# Patient Record
Sex: Female | Born: 1960
Health system: Southern US, Community
[De-identification: ages and names within clinical notes are randomized; demographics above are authoritative.]

## PROBLEM LIST (undated history)

## (undated) DIAGNOSIS — D219 Benign neoplasm of connective and other soft tissue, unspecified: Secondary | ICD-10-CM

## (undated) DIAGNOSIS — F329 Major depressive disorder, single episode, unspecified: Secondary | ICD-10-CM

## (undated) DIAGNOSIS — N6019 Diffuse cystic mastopathy of unspecified breast: Secondary | ICD-10-CM

## (undated) DIAGNOSIS — F32A Depression, unspecified: Secondary | ICD-10-CM

## (undated) DIAGNOSIS — Z780 Asymptomatic menopausal state: Secondary | ICD-10-CM

## (undated) DIAGNOSIS — K219 Gastro-esophageal reflux disease without esophagitis: Secondary | ICD-10-CM

## (undated) DIAGNOSIS — E559 Vitamin D deficiency, unspecified: Secondary | ICD-10-CM

## (undated) DIAGNOSIS — K649 Unspecified hemorrhoids: Secondary | ICD-10-CM

## (undated) DIAGNOSIS — E010 Iodine-deficiency related diffuse (endemic) goiter: Secondary | ICD-10-CM

## (undated) DIAGNOSIS — R002 Palpitations: Secondary | ICD-10-CM

## (undated) HISTORY — PX: PELVIC LAPAROSCOPY: SHX162

## (undated) HISTORY — PX: BREAST SURGERY: SHX581

## (undated) HISTORY — DX: Gastro-esophageal reflux disease without esophagitis: K21.9

## (undated) HISTORY — PX: NASAL SEPTUM SURGERY: SHX37

## (undated) HISTORY — DX: Benign neoplasm of connective and other soft tissue, unspecified: D21.9

---

## 1988-02-02 HISTORY — PX: BREAST EXCISIONAL BIOPSY: SUR124

## 1998-02-04 ENCOUNTER — Other Ambulatory Visit: Admission: RE | Admit: 1998-02-04 | Discharge: 1998-02-04 | Payer: Self-pay | Admitting: Obstetrics and Gynecology

## 1999-02-11 ENCOUNTER — Other Ambulatory Visit: Admission: RE | Admit: 1999-02-11 | Discharge: 1999-02-11 | Payer: Self-pay | Admitting: Obstetrics and Gynecology

## 1999-03-23 ENCOUNTER — Encounter: Payer: Self-pay | Admitting: Obstetrics and Gynecology

## 1999-03-23 ENCOUNTER — Encounter: Admission: RE | Admit: 1999-03-23 | Discharge: 1999-03-23 | Payer: Self-pay | Admitting: Obstetrics and Gynecology

## 1999-03-27 ENCOUNTER — Encounter: Payer: Self-pay | Admitting: Obstetrics and Gynecology

## 1999-03-27 ENCOUNTER — Encounter: Admission: RE | Admit: 1999-03-27 | Discharge: 1999-03-27 | Payer: Self-pay | Admitting: Obstetrics and Gynecology

## 1999-10-08 ENCOUNTER — Encounter: Admission: RE | Admit: 1999-10-08 | Discharge: 1999-10-08 | Payer: Self-pay | Admitting: Surgery

## 1999-10-08 ENCOUNTER — Encounter: Payer: Self-pay | Admitting: Surgery

## 1999-10-08 ENCOUNTER — Encounter (INDEPENDENT_AMBULATORY_CARE_PROVIDER_SITE_OTHER): Payer: Self-pay | Admitting: Specialist

## 1999-10-09 ENCOUNTER — Other Ambulatory Visit: Admission: RE | Admit: 1999-10-09 | Discharge: 1999-10-09 | Payer: Self-pay | Admitting: Surgery

## 2000-04-06 ENCOUNTER — Encounter: Payer: Self-pay | Admitting: Obstetrics and Gynecology

## 2000-04-06 ENCOUNTER — Encounter: Admission: RE | Admit: 2000-04-06 | Discharge: 2000-04-06 | Payer: Self-pay | Admitting: Obstetrics and Gynecology

## 2001-02-21 ENCOUNTER — Encounter: Admission: RE | Admit: 2001-02-21 | Discharge: 2001-02-21 | Payer: Self-pay | Admitting: Obstetrics and Gynecology

## 2001-02-21 ENCOUNTER — Other Ambulatory Visit: Admission: RE | Admit: 2001-02-21 | Discharge: 2001-02-21 | Payer: Self-pay | Admitting: Obstetrics and Gynecology

## 2001-02-21 ENCOUNTER — Encounter: Payer: Self-pay | Admitting: Obstetrics and Gynecology

## 2002-05-18 ENCOUNTER — Encounter: Admission: RE | Admit: 2002-05-18 | Discharge: 2002-05-18 | Payer: Self-pay | Admitting: Obstetrics and Gynecology

## 2002-05-18 ENCOUNTER — Other Ambulatory Visit: Admission: RE | Admit: 2002-05-18 | Discharge: 2002-05-18 | Payer: Self-pay | Admitting: Obstetrics and Gynecology

## 2002-05-18 ENCOUNTER — Encounter: Payer: Self-pay | Admitting: Obstetrics and Gynecology

## 2002-05-25 ENCOUNTER — Encounter: Admission: RE | Admit: 2002-05-25 | Discharge: 2002-05-25 | Payer: Self-pay | Admitting: Obstetrics and Gynecology

## 2002-05-25 ENCOUNTER — Encounter: Payer: Self-pay | Admitting: Obstetrics and Gynecology

## 2003-06-06 ENCOUNTER — Encounter: Admission: RE | Admit: 2003-06-06 | Discharge: 2003-06-06 | Payer: Self-pay | Admitting: Obstetrics and Gynecology

## 2003-06-21 ENCOUNTER — Encounter: Admission: RE | Admit: 2003-06-21 | Discharge: 2003-06-21 | Payer: Self-pay | Admitting: Obstetrics and Gynecology

## 2003-06-21 ENCOUNTER — Other Ambulatory Visit: Admission: RE | Admit: 2003-06-21 | Discharge: 2003-06-21 | Payer: Self-pay | Admitting: Obstetrics and Gynecology

## 2004-06-24 ENCOUNTER — Encounter: Admission: RE | Admit: 2004-06-24 | Discharge: 2004-06-24 | Payer: Self-pay | Admitting: Obstetrics and Gynecology

## 2004-06-24 ENCOUNTER — Other Ambulatory Visit: Admission: RE | Admit: 2004-06-24 | Discharge: 2004-06-24 | Payer: Self-pay | Admitting: Obstetrics and Gynecology

## 2004-08-14 ENCOUNTER — Ambulatory Visit: Payer: Self-pay | Admitting: Internal Medicine

## 2005-06-25 ENCOUNTER — Encounter: Admission: RE | Admit: 2005-06-25 | Discharge: 2005-06-25 | Payer: Self-pay | Admitting: Obstetrics and Gynecology

## 2005-06-25 ENCOUNTER — Other Ambulatory Visit: Admission: RE | Admit: 2005-06-25 | Discharge: 2005-06-25 | Payer: Self-pay | Admitting: Obstetrics and Gynecology

## 2005-12-13 ENCOUNTER — Ambulatory Visit: Payer: Self-pay | Admitting: Unknown Physician Specialty

## 2006-07-15 ENCOUNTER — Encounter: Admission: RE | Admit: 2006-07-15 | Discharge: 2006-07-15 | Payer: Self-pay | Admitting: Obstetrics and Gynecology

## 2006-07-15 ENCOUNTER — Other Ambulatory Visit: Admission: RE | Admit: 2006-07-15 | Discharge: 2006-07-15 | Payer: Self-pay | Admitting: Obstetrics and Gynecology

## 2006-10-21 ENCOUNTER — Other Ambulatory Visit: Admission: RE | Admit: 2006-10-21 | Discharge: 2006-10-21 | Payer: Self-pay | Admitting: Obstetrics and Gynecology

## 2006-12-05 ENCOUNTER — Ambulatory Visit: Payer: Self-pay | Admitting: Internal Medicine

## 2007-07-03 HISTORY — PX: HYSTEROSCOPY: SHX211

## 2007-07-14 ENCOUNTER — Ambulatory Visit (HOSPITAL_BASED_OUTPATIENT_CLINIC_OR_DEPARTMENT_OTHER): Admission: RE | Admit: 2007-07-14 | Discharge: 2007-07-14 | Payer: Self-pay | Admitting: Obstetrics and Gynecology

## 2007-07-14 ENCOUNTER — Encounter: Payer: Self-pay | Admitting: Obstetrics and Gynecology

## 2007-07-28 ENCOUNTER — Encounter: Admission: RE | Admit: 2007-07-28 | Discharge: 2007-07-28 | Payer: Self-pay | Admitting: Obstetrics and Gynecology

## 2007-07-28 ENCOUNTER — Other Ambulatory Visit: Admission: RE | Admit: 2007-07-28 | Discharge: 2007-07-28 | Payer: Self-pay | Admitting: Obstetrics and Gynecology

## 2007-08-07 ENCOUNTER — Encounter: Admission: RE | Admit: 2007-08-07 | Discharge: 2007-08-07 | Payer: Self-pay | Admitting: Obstetrics and Gynecology

## 2008-06-10 ENCOUNTER — Ambulatory Visit: Payer: Self-pay | Admitting: Obstetrics and Gynecology

## 2008-07-08 ENCOUNTER — Emergency Department: Payer: Self-pay | Admitting: Emergency Medicine

## 2008-07-18 ENCOUNTER — Ambulatory Visit: Payer: Self-pay | Admitting: Obstetrics and Gynecology

## 2008-07-19 ENCOUNTER — Ambulatory Visit: Payer: Self-pay | Admitting: Otolaryngology

## 2008-08-12 ENCOUNTER — Encounter: Admission: RE | Admit: 2008-08-12 | Discharge: 2008-08-12 | Payer: Self-pay | Admitting: Obstetrics and Gynecology

## 2008-08-12 ENCOUNTER — Encounter: Payer: Self-pay | Admitting: Obstetrics and Gynecology

## 2008-08-12 ENCOUNTER — Other Ambulatory Visit: Admission: RE | Admit: 2008-08-12 | Discharge: 2008-08-12 | Payer: Self-pay | Admitting: Obstetrics and Gynecology

## 2008-08-12 ENCOUNTER — Ambulatory Visit: Payer: Self-pay | Admitting: Obstetrics and Gynecology

## 2009-07-18 ENCOUNTER — Other Ambulatory Visit: Admission: RE | Admit: 2009-07-18 | Discharge: 2009-07-18 | Payer: Self-pay | Admitting: Obstetrics and Gynecology

## 2009-07-18 ENCOUNTER — Encounter: Admission: RE | Admit: 2009-07-18 | Discharge: 2009-07-18 | Payer: Self-pay | Admitting: Obstetrics and Gynecology

## 2009-07-18 ENCOUNTER — Ambulatory Visit: Payer: Self-pay | Admitting: Obstetrics and Gynecology

## 2009-12-03 ENCOUNTER — Ambulatory Visit: Payer: Self-pay | Admitting: Obstetrics and Gynecology

## 2010-02-23 ENCOUNTER — Encounter: Payer: Self-pay | Admitting: Obstetrics and Gynecology

## 2010-06-16 NOTE — Op Note (Signed)
Briana Griffin, Briana Griffin                ACCOUNT NO.:  [id number]   MEDICAL RECORD NO.:  [id number]          PATIENT TYPE:  AMB   LOCATION:  [facility]                         FACILITY:  [facility]   PHYSICIAN:  Daniel L. Gottsegen, M.D.DATE OF BIRTH:  [birth year]   DATE OF PROCEDURE:  DATE OF DISCHARGE:                               OPERATIVE REPORT   PREOPERATIVE DIAGNOSES:  1. Dysfunctional uterine bleeding.  2. Endometrial polyp.   POSTOPERATIVE DIAGNOSES:  1. Dysfunctional uterine bleeding.  2. Endometrial polyp.   OPERATIONS:  Dilatation and curettage, hysteroscopy.   SURGEON:  Daniel L. Eda Paschal, M.D.   ANESTHESIA:  General.   INDICATIONS:  The patient is a 50 year old gravida 1, para 1, AB 0, who  had presented to the office with a 2-week history of menometrorrhagia.  Saline infusion hysterogram was done in the office.  The patient does  have intramural myomas but, in addition, she had something in her  endometrial cavity consistent with a small endometrial polyp, about 1.5  cm.  As a result of this and need for a tissue diagnosis, the .  patient  was taken to the operating room for a hysteroscopy D&C.   FINDINGS:  External is normal.  BUS is normal.  Vaginal is normal.  Cervix showed a stenotic os.  When it dilated a lot of mucus came out of  the os.  Uterus is slightly enlarged from myomas.  There is no uterine  descensus.  Adnexa fail to reveal masses.  At the time of hysteroscopy,  the patient had an endometrial polyp coming off the posterior wall of  the fundus of about 1.5 cm.  Other than that the lining of the  endometrium  actually appeared somewhat atrophic with very little  tissue.  Tubal ostia, top of the fundus, anterior and posterior walls of  the fundus, lower uterine segment and endocervical canal were all free  of disease.   PROCEDURE:  After adequate general anesthesia, the patient was placed in  the dorsal lithotomy position, prepped and draped in the usual  sterile  manner.  A single-tooth tenaculum was placed on the anterior lip of the  cervix, the cervix dilated to a #31 Pratt dilator.  As the cervix was  being dilated a lot of mucus came out of the os, possibly related to the  fact that the os had been stenotic.  When she was dilated, the  hysteroscopic resectoscope was introduced into the cavity.  A camera was  used for magnification and 3% sorbitol was used to expand the  intrauterine cavity.  A 90 degree  wire loop was utilized, attached to  appropriate Bovie settings.  The above findings were noted.  The  endometrial polyp was excised with the wire loop without difficulty.  There was no bleeding noted.  Endometrial curettings were obtained.  There was no bleeding noted.  The  patient tolerated the procedure well.  Fluid deficit was 75 mL.  The  procedure was terminated and the patient left the operating room in  satisfactory condition.      Daniel L. Eda Paschal, M.D.  Electronically Signed     DLG/MEDQ  D:  07/14/2007  T:  07/14/2007  Job:  [id number]

## 2010-07-15 ENCOUNTER — Other Ambulatory Visit: Payer: Self-pay | Admitting: Obstetrics and Gynecology

## 2010-07-15 DIAGNOSIS — Z1231 Encounter for screening mammogram for malignant neoplasm of breast: Secondary | ICD-10-CM

## 2010-08-07 ENCOUNTER — Ambulatory Visit (INDEPENDENT_AMBULATORY_CARE_PROVIDER_SITE_OTHER): Payer: BC Managed Care – PPO | Admitting: Obstetrics and Gynecology

## 2010-08-07 ENCOUNTER — Other Ambulatory Visit: Payer: Self-pay | Admitting: Obstetrics and Gynecology

## 2010-08-07 ENCOUNTER — Other Ambulatory Visit: Payer: BC Managed Care – PPO

## 2010-08-07 ENCOUNTER — Ambulatory Visit
Admission: RE | Admit: 2010-08-07 | Discharge: 2010-08-07 | Disposition: A | Discharge status: Home / Self Care | Payer: BC Managed Care – PPO | Source: Ambulatory Visit | Attending: Obstetrics and Gynecology | Admitting: Obstetrics and Gynecology

## 2010-08-07 ENCOUNTER — Other Ambulatory Visit (HOSPITAL_COMMUNITY)
Admission: RE | Admit: 2010-08-07 | Discharge: 2010-08-07 | Disposition: A | Discharge status: Home / Self Care | Payer: BC Managed Care – PPO | Source: Ambulatory Visit | Attending: Obstetrics and Gynecology | Admitting: Obstetrics and Gynecology

## 2010-08-07 DIAGNOSIS — Z1231 Encounter for screening mammogram for malignant neoplasm of breast: Secondary | ICD-10-CM

## 2010-08-07 DIAGNOSIS — Z124 Encounter for screening for malignant neoplasm of cervix: Secondary | ICD-10-CM

## 2010-08-07 DIAGNOSIS — R1031 Right lower quadrant pain: Secondary | ICD-10-CM

## 2010-08-07 DIAGNOSIS — Z803 Family history of malignant neoplasm of breast: Secondary | ICD-10-CM

## 2010-08-07 DIAGNOSIS — Z01419 Encounter for gynecological examination (general) (routine) without abnormal findings: Secondary | ICD-10-CM

## 2010-08-11 ENCOUNTER — Ambulatory Visit: Payer: Self-pay | Admitting: Internal Medicine

## 2010-10-29 LAB — URINALYSIS, ROUTINE W REFLEX MICROSCOPIC: pH: 7.5

## 2010-10-29 LAB — POCT PREGNANCY, URINE: Preg Test, Ur: NEGATIVE

## 2010-10-29 LAB — POCT HEMOGLOBIN-HEMACUE: Hemoglobin: 16.5 — ABNORMAL HIGH

## 2011-03-12 ENCOUNTER — Ambulatory Visit: Payer: Self-pay | Admitting: Otolaryngology

## 2011-03-12 LAB — CBC WITH DIFFERENTIAL/PLATELET
Basophil #: 0.1 10*3/uL (ref 0.0–0.1)
Basophil %: 1.4 %
Eosinophil #: 0 10*3/uL (ref 0.0–0.7)
Eosinophil %: 0.5 %
HCT: 48.4 % — ABNORMAL HIGH (ref 35.0–47.0)
HGB: 15.8 g/dL (ref 12.0–16.0)
MCV: 91 fL (ref 80–100)
Monocyte #: 0.5 10*3/uL (ref 0.0–0.7)
Monocyte %: 7.4 %
Platelet: 200 10*3/uL (ref 150–440)
RBC: 5.32 10*6/uL — ABNORMAL HIGH (ref 3.80–5.20)
WBC: 6.4 10*3/uL (ref 3.6–11.0)

## 2011-03-12 LAB — T4, FREE: Free Thyroxine: 0.94 ng/dL (ref 0.76–1.46)

## 2011-04-26 ENCOUNTER — Telehealth: Payer: Self-pay | Admitting: *Deleted

## 2011-04-26 DIAGNOSIS — R102 Pelvic and perineal pain: Secondary | ICD-10-CM

## 2011-04-26 NOTE — Telephone Encounter (Signed)
Ultrasound with office visit 

## 2011-04-26 NOTE — Telephone Encounter (Signed)
Patient informed.  appts to set up. 

## 2011-04-26 NOTE — Telephone Encounter (Signed)
Patient calls today c/o bilat "ovary pain" and back discomfort for about two weeks now.  No troubles going to the bathroom but has a history of cysts in the past.  Lives in [place] and wants to know if you would see her but also check an ultrasound because of her traveling a good distance.

## 2011-04-29 DIAGNOSIS — K219 Gastro-esophageal reflux disease without esophagitis: Secondary | ICD-10-CM | POA: Insufficient documentation

## 2011-04-29 DIAGNOSIS — D219 Benign neoplasm of connective and other soft tissue, unspecified: Secondary | ICD-10-CM | POA: Insufficient documentation

## 2011-04-29 DIAGNOSIS — N809 Endometriosis, unspecified: Secondary | ICD-10-CM | POA: Insufficient documentation

## 2011-05-06 ENCOUNTER — Ambulatory Visit: Payer: BC Managed Care – PPO | Admitting: Obstetrics and Gynecology

## 2011-05-06 ENCOUNTER — Other Ambulatory Visit: Payer: BC Managed Care – PPO

## 2011-06-29 ENCOUNTER — Other Ambulatory Visit: Payer: Self-pay | Admitting: Obstetrics and Gynecology

## 2011-06-29 ENCOUNTER — Telehealth: Payer: Self-pay | Admitting: *Deleted

## 2011-06-29 DIAGNOSIS — Z1231 Encounter for screening mammogram for malignant neoplasm of breast: Secondary | ICD-10-CM

## 2011-06-29 NOTE — Telephone Encounter (Signed)
Pt informed with the below note, transferred to appointment desk 

## 2011-06-29 NOTE — Telephone Encounter (Signed)
Pt called on 04/26/11 c/o bilat ovary pain that comes and goes, OV with ultrasound scheduled, but pt canceled appointment. She has her annual coming up in July and would like to know if she could have ultrasound and annual done same day? Please advise

## 2011-06-29 NOTE — Telephone Encounter (Signed)
If insurance would pay for both the same day.

## 2011-08-24 ENCOUNTER — Telehealth: Payer: Self-pay | Admitting: *Deleted

## 2011-08-24 NOTE — Telephone Encounter (Signed)
Pt called and said her PCP told her that she has Vit. D deficiency at 7.1, she will start on Vit D medication. Pt asked if she should have a bone density done because of this? Please advise

## 2011-09-03 ENCOUNTER — Ambulatory Visit: Payer: BC Managed Care – PPO

## 2011-09-03 ENCOUNTER — Encounter: Payer: Self-pay | Admitting: Obstetrics and Gynecology

## 2011-09-03 ENCOUNTER — Ambulatory Visit (INDEPENDENT_AMBULATORY_CARE_PROVIDER_SITE_OTHER): Payer: BC Managed Care – PPO

## 2011-09-03 ENCOUNTER — Other Ambulatory Visit (HOSPITAL_COMMUNITY)
Admission: RE | Admit: 2011-09-03 | Discharge: 2011-09-03 | Disposition: A | Discharge status: Home / Self Care | Payer: BC Managed Care – PPO | Source: Ambulatory Visit | Attending: Obstetrics and Gynecology | Admitting: Obstetrics and Gynecology

## 2011-09-03 ENCOUNTER — Ambulatory Visit (INDEPENDENT_AMBULATORY_CARE_PROVIDER_SITE_OTHER): Payer: BC Managed Care – PPO | Admitting: Obstetrics and Gynecology

## 2011-09-03 VITALS — BP 124/78 | Ht 61.0 in | Wt 190.0 lb

## 2011-09-03 DIAGNOSIS — Z01419 Encounter for gynecological examination (general) (routine) without abnormal findings: Secondary | ICD-10-CM

## 2011-09-03 DIAGNOSIS — N949 Unspecified condition associated with female genital organs and menstrual cycle: Secondary | ICD-10-CM

## 2011-09-03 DIAGNOSIS — R102 Pelvic and perineal pain: Secondary | ICD-10-CM

## 2011-09-03 NOTE — Progress Notes (Signed)
Patient came to see me today for her annual GYN exam. She is menopausal has not bled for 3 years. She has had some menopausal symptoms but controls them with Celexa and BuSpar. She continues to have intermittent right lower quadrant pain. She does have a history of endometriosis. As a result of this we'll schedule ultrasound today as well as our visit. She is scheduled for endoscopy and colonoscopy due to pain and reflux.  She is having no dyspareunia. She was supposed to have her mammogram today but there was a problem at the breast center and she will return for her mammogram and  bone density. Her last bone density was normal. She had her lab work from her PCP and her vitamin D was 7. She is currently on 50,000 IUs of vitamin D weekly. She has had no fractures. She had cervical dysplasia greater than 25 years ago and has had normal Paps since then. Her last Pap was last year and was normal.  Physical examination: Kennon Portela present. HEENT within normal limits. Neck: Thyroid not large. No masses. Supraclavicular nodes: not enlarged. Breasts: Examined in both sitting and lying  position. No skin changes and no masses. Abdomen: Soft no guarding rebound or masses or hernia. Pelvic: External: Within normal limits. BUS: Within normal limits. Vaginal:within normal limits. Good estrogen effect. No evidence of cystocele rectocele or enterocele. Cervix: clean. Uterus: Normal size and shape. Adnexa: No masses. Rectovaginal exam: Confirmatory and negative. Extremities: Within normal limits.  Assessment: #1. Right lower quadrant pain with history of endometriosis #2. Vitamin D deficiency. #3. Fibroids  Plan: Ultrasound was done. She still has 3 small fibroids that are stable in size. Her ovaries are normal. Her cul-de-sac is free of fluid. She was reassured. She'll get mammogram and a bone density.The new Pap smear guidelines were discussed with the patient. Pap done.

## 2011-09-05 LAB — URINALYSIS W MICROSCOPIC + REFLEX CULTURE
Casts: NONE SEEN
Leukocytes, UA: NEGATIVE
pH: 7 (ref 5.0–8.0)

## 2011-09-08 LAB — URINE CULTURE

## 2011-09-09 ENCOUNTER — Other Ambulatory Visit: Payer: Self-pay | Admitting: Obstetrics and Gynecology

## 2011-09-09 DIAGNOSIS — N390 Urinary tract infection, site not specified: Secondary | ICD-10-CM

## 2011-09-09 MED ORDER — CIPROFLOXACIN HCL 500 MG PO TABS: 500.0000 mg | ORAL_TABLET | Freq: Two times a day (BID) | ORAL | Status: AC

## 2011-09-13 ENCOUNTER — Ambulatory Visit: Payer: BC Managed Care – PPO

## 2011-09-22 ENCOUNTER — Ambulatory Visit: Payer: BC Managed Care – PPO

## 2011-09-24 ENCOUNTER — Ambulatory Visit: Payer: BC Managed Care – PPO

## 2011-09-28 ENCOUNTER — Ambulatory Visit
Admission: RE | Admit: 2011-09-28 | Discharge: 2011-09-28 | Disposition: A | Discharge status: Home / Self Care | Payer: BC Managed Care – PPO | Source: Ambulatory Visit | Attending: Obstetrics and Gynecology | Admitting: Obstetrics and Gynecology

## 2011-09-28 DIAGNOSIS — Z1231 Encounter for screening mammogram for malignant neoplasm of breast: Secondary | ICD-10-CM

## 2012-06-12 ENCOUNTER — Other Ambulatory Visit: Payer: Self-pay

## 2012-06-12 DIAGNOSIS — Z1231 Encounter for screening mammogram for malignant neoplasm of breast: Secondary | ICD-10-CM

## 2012-07-31 ENCOUNTER — Other Ambulatory Visit: Payer: Self-pay | Admitting: Internal Medicine

## 2012-07-31 ENCOUNTER — Telehealth: Payer: Self-pay | Admitting: *Deleted

## 2012-07-31 DIAGNOSIS — N63 Unspecified lump in unspecified breast: Secondary | ICD-10-CM

## 2012-07-31 NOTE — Telephone Encounter (Signed)
PT CALLED C/O LUMP IN RIGHT BREAST, (PT HAS ANNUAL SCHEDULED WITH YOU ON 09/04/12) REQUESTING A ORDER TO BE PLACED AT BREAST CENTER. I EXPLAINED TO PT THAT POLICY IS TO SEE THE PHYSICIAN FIRST. PT SAID "IT DOESN'T MAKE SENSE TO MAKE OV WITH PHYSICIAN WHEN I CLEARLY NEED A MAMMOGRAM APPOINTMENT". OKAY TO PLACE ORDER? OR OV WITH YOU?

## 2012-07-31 NOTE — Telephone Encounter (Signed)
PT INFORMED WITH THE BELOW NOTE, PT SAID SHE IS GOING TO HAVE HER PCP WRITE ORDER BECAUSE SHE IS NOT GOING TO PAY A COPAY FOR OV.

## 2012-07-31 NOTE — Telephone Encounter (Signed)
Patient needs examination before any order is placed. Depending on what we feel may change what we order

## 2012-08-10 ENCOUNTER — Other Ambulatory Visit: Payer: BC Managed Care – PPO

## 2012-08-18 ENCOUNTER — Ambulatory Visit
Admission: RE | Admit: 2012-08-18 | Discharge: 2012-08-18 | Disposition: A | Discharge status: Home / Self Care | Payer: BC Managed Care – PPO | Source: Ambulatory Visit | Attending: Internal Medicine | Admitting: Internal Medicine

## 2012-08-18 DIAGNOSIS — N63 Unspecified lump in unspecified breast: Secondary | ICD-10-CM

## 2012-08-23 ENCOUNTER — Encounter: Payer: Self-pay | Admitting: Gynecology

## 2012-09-04 ENCOUNTER — Ambulatory Visit: Payer: BC Managed Care – PPO

## 2012-09-04 ENCOUNTER — Other Ambulatory Visit: Payer: Self-pay | Admitting: Internal Medicine

## 2012-09-04 ENCOUNTER — Ambulatory Visit (INDEPENDENT_AMBULATORY_CARE_PROVIDER_SITE_OTHER): Payer: BC Managed Care – PPO | Admitting: Women's Health

## 2012-09-04 ENCOUNTER — Encounter: Payer: Self-pay | Admitting: Gynecology

## 2012-09-04 ENCOUNTER — Ambulatory Visit
Admission: RE | Admit: 2012-09-04 | Discharge: 2012-09-04 | Disposition: A | Discharge status: Home / Self Care | Payer: BC Managed Care – PPO | Source: Ambulatory Visit | Attending: Internal Medicine | Admitting: Internal Medicine

## 2012-09-04 ENCOUNTER — Ambulatory Visit: Admission: RE | Admit: 2012-09-04 | Payer: BC Managed Care – PPO | Source: Ambulatory Visit

## 2012-09-04 ENCOUNTER — Encounter: Payer: Self-pay | Admitting: Women's Health

## 2012-09-04 VITALS — BP 120/71 | Ht 61.0 in | Wt 192.0 lb

## 2012-09-04 DIAGNOSIS — Z01419 Encounter for gynecological examination (general) (routine) without abnormal findings: Secondary | ICD-10-CM

## 2012-09-04 DIAGNOSIS — N63 Unspecified lump in unspecified breast: Secondary | ICD-10-CM

## 2012-09-04 DIAGNOSIS — Z23 Encounter for immunization: Secondary | ICD-10-CM

## 2012-09-04 NOTE — Progress Notes (Signed)
Briana Griffin [birth year] [id number]    History:    The patient presents for annual exam.  Postmenopausal/no bleeding/no HRT/with rare intercourse per choice. Primary care labs and meds. Effexor for depression with anxiety. Benign breast biopsy, normal mammogram after. Breast cancer [place] 60s, [place] 70's, and  maternal cousin age 54. Negative colonoscopy 2013. History of small fibroids and endometriosis. DEXA  2013 in [place], stable per patient, primary care managing. Normal DEXA 2011. Mild dysplasia 25 years ago, normal Paps after. Has occasional lower abdominal cramping, resolves without treatment. Denies GI or GU changes.  Past medical history, past surgical history, family history and social history were all reviewed and documented in the EPIC chart. 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 Has an 77-month-old grandson Briana Griffin, son 18 doing well.   ROS:  A  ROS was performed and pertinent positives and negatives are included in the history.  Exam:  Filed Vitals:   09/04/12 1407  BP: 120/71    General appearance:  Normal Head/Neck:  Normal, without cervical or supraclavicular adenopathy. Thyroid:  Symmetrical, normal in size, without palpable masses or nodularity. Respiratory  Effort:  Normal  Auscultation:  Clear without wheezing or rhonchi Cardiovascular  Auscultation:  Regular rate, without rubs, murmurs or gallops  Edema/varicosities:  Not grossly evident Abdominal  Soft,nontender, without masses, guarding or rebound.  Liver/spleen:  No organomegaly noted  Hernia:  None appreciated  Skin  Inspection:  Grossly normal  Palpation:  Grossly normal Neurologic/psychiatric  Orientation:  Normal with appropriate conversation.  Mood/affect:  Normal  Genitourinary    Breasts: Examined lying and sitting.     Right: Without masses, retractions, discharge or axillary adenopathy.     Left: Without masses, retractions, discharge  or axillary adenopathy.   Inguinal/mons:  Normal without inguinal adenopathy  External genitalia:  Normal  BUS/Urethra/Skene's glands:  Normal  Bladder:  Normal  Vagina:  Normal  Cervix:  Normal  Uterus:   normal in size, shape and contour.  Midline and mobile  Adnexa/parametria:     Rt: Without masses or tenderness.   Lt: Without masses or tenderness.  Anus and perineum: Normal  Digital rectal exam: Normal sphincter tone without palpated masses or tenderness  Assessment/Plan:  52 y.o. MWF G1P1 for annual exam.   Postmenopausal/no HRT/no bleeding GERD Anxiety with depression-Effexor per primary care  Plan: Continue labs at primary care. SBE's, continue annual mammogram, calcium rich diet, vitamin D 50,000 weekly per primary care. Home safety and fall prevention discussed. Tdap given today. Pap, Pap normal 2012, new screening guidelines reviewed.    Harrington Challenger [facility], 2:41 PM 09/04/2012

## 2012-09-04 NOTE — Patient Instructions (Signed)
Health Recommendations for Postmenopausal Women Respected and ongoing research has looked at the most common causes of death, disability, and poor quality of life in postmenopausal women. The causes include heart disease, diseases of blood vessels, diabetes, depression, cancer, and bone loss (osteoporosis). Many things can be done to help lower the chances of developing these and other common problems: CARDIOVASCULAR DISEASE Heart Disease: A heart attack is a medical emergency. Know the signs and symptoms of a heart attack. Below are things women can do to reduce their risk for heart disease.   Do not smoke. If you smoke, quit.  Aim for a healthy weight. Being overweight causes many preventable deaths. Eat a healthy and balanced diet and drink an adequate amount of liquids.  Get moving. Make a commitment to be more physically active. Aim for 30 minutes of activity on most, if not all days of the week.  Eat for heart health. Choose a diet that is low in saturated fat and cholesterol and eliminate trans fat. Include whole grains, vegetables, and fruits. Read and understand the labels on food containers before buying.  Know your numbers. Ask your caregiver to check your blood pressure, cholesterol (total, HDL, LDL, triglycerides) and blood glucose. Work with your caregiver on improving your entire clinical picture.  High blood pressure. Limit or stop your table salt intake (try salt substitute and food seasonings). Avoid salty foods and drinks. Read labels on food containers before buying. Eating well and exercising can help control high blood pressure. STROKE  Stroke is a medical emergency. Stroke may be the result of a blood clot in a blood vessel in the brain or by a brain hemorrhage (bleeding). Know the signs and symptoms of a stroke. To lower the risk of developing a stroke:  Avoid fatty foods.  Quit smoking.  Control your diabetes, blood pressure, and irregular heart rate. THROMBOPHLEBITIS  (BLOOD CLOT) OF THE LEG  Becoming overweight and leading a stationary lifestyle may also contribute to developing blood clots. Controlling your diet and exercising will help lower the risk of developing blood clots. CANCER SCREENING  Breast Cancer: Take steps to reduce your risk of breast cancer.  You should practice "breast self-awareness." This means understanding the normal appearance and feel of your breasts and should include breast self-examination. Any changes detected, no matter how small, should be reported to your caregiver.  After age 40, you should have a clinical breast exam (CBE) every year.  Starting at age 40, you should consider having a mammogram (breast X-ray) every year.  If you have a family history of breast cancer, talk to your caregiver about genetic screening.  If you are at high risk for breast cancer, talk to your caregiver about having an MRI and a mammogram every year.  Intestinal or Stomach Cancer: Tests to consider are a rectal exam, fecal occult blood, sigmoidoscopy, and colonoscopy. Women who are high risk may need to be screened at an earlier age and more often.  Cervical Cancer:  Beginning at age 30, you should have a Pap test every 3 years as long as the past 3 Pap tests have been normal.  If you have had past treatment for cervical cancer or a condition that could lead to cancer, you need Pap tests and screening for cancer for at least 20 years after your treatment.  If you had a hysterectomy for a problem that was not cancer or a condition that could lead to cancer, then you no longer need Pap tests.    If you are between ages 65 and 70, and you have had normal Pap tests going back 10 years, you no longer need Pap tests.  If Pap tests have been discontinued, risk factors (such as a new sexual partner) need to be reassessed to determine if screening should be resumed.  Some medical problems can increase the chance of getting cervical cancer. In these  cases, your caregiver may recommend more frequent screening and Pap tests.  Uterine Cancer: If you have vaginal bleeding after reaching menopause, you should notify your caregiver.  Ovarian cancer: Other than yearly pelvic exams, there are no reliable tests available to screen for ovarian cancer at this time except for yearly pelvic exams.  Lung Cancer: Yearly chest X-rays can detect lung cancer and should be done on high risk women, such as cigarette smokers and women with chronic lung disease (emphysema).  Skin Cancer: A complete body skin exam should be done at your yearly examination. Avoid overexposure to the sun and ultraviolet light lamps. Use a strong sun block cream when in the sun. All of these things are important in lowering the risk of skin cancer. MENOPAUSE Menopause Symptoms: Hormone therapy products are effective for treating symptoms associated with menopause:  Moderate to severe hot flashes.  Night sweats.  Mood swings.  Headaches.  Tiredness.  Loss of sex drive.  Insomnia.  Other symptoms. Hormone replacement carries certain risks, especially in older women. Women who use or are thinking about using estrogen or estrogen with progestin treatments should discuss that with their caregiver. Your caregiver will help you understand the benefits and risks. The ideal dose of hormone replacement therapy is not known. The Food and Drug Administration (FDA) has concluded that hormone therapy should be used only at the lowest doses and for the shortest amount of time to reach treatment goals.  OSTEOPOROSIS Protecting Against Bone Loss and Preventing Fracture: If you use hormone therapy for prevention of bone loss (osteoporosis), the risks for bone loss must outweigh the risk of the therapy. Ask your caregiver about other medications known to be safe and effective for preventing bone loss and fractures. To guard against bone loss or fractures, the following is recommended:  If  you are less than age 50, take 1000 mg of calcium and at least 600 mg of Vitamin D per day.  If you are greater than age 50 but less than age 70, take 1200 mg of calcium and at least 600 mg of Vitamin D per day.  If you are greater than age 70, take 1200 mg of calcium and at least 800 mg of Vitamin D per day. Smoking and excessive alcohol intake increases the risk of osteoporosis. Eat foods rich in calcium and vitamin D and do weight bearing exercises several times a week as your caregiver suggests. DIABETES Diabetes Melitus: If you have Type I or Type 2 diabetes, you should keep your blood sugar under control with diet, exercise and recommended medication. Avoid too many sweets, starchy and fatty foods. Being overweight can make control more difficult. COGNITION AND MEMORY Cognition and Memory: Menopausal hormone therapy is not recommended for the prevention of cognitive disorders such as Alzheimer's disease or memory loss.  DEPRESSION  Depression may occur at any age, but is common in elderly women. The reasons may be because of physical, medical, social (loneliness), or financial problems and needs. If you are experiencing depression because of medical problems and control of symptoms, talk to your caregiver about this. Physical activity and   exercise may help with mood and sleep. Community and volunteer involvement may help your sense of value and worth. If you have depression and you feel that the problem is getting worse or becoming severe, talk to your caregiver about treatment options that are best for you. ACCIDENTS  Accidents are common and can be serious in the elderly woman. Prepare your house to prevent accidents. Eliminate throw rugs, place hand bars in the bath, shower and toilet areas. Avoid wearing high heeled shoes or walking on wet, snowy, and icy areas. Limit or stop driving if you have vision or hearing problems, or you feel you are unsteady with you movements and  reflexes. HEPATITIS C Hepatitis C is a type of viral infection affecting the liver. It is spread mainly through contact with blood from an infected person. It can be treated, but if left untreated, it can lead to severe liver damage over years. Many people who are infected do not know that the virus is in their blood. If you are a "baby-boomer", it is recommended that you have one screening test for Hepatitis C. IMMUNIZATIONS  Several immunizations are important to consider having during your senior years, including:   Tetanus, diptheria, and pertussis booster shot.  Influenza every year before the flu season begins.  Pneumonia vaccine.  Shingles vaccine.  Others as indicated based on your specific needs. Talk to your caregiver about these. Document Released: 03/12/2005 Document Revised: 01/05/2012 Document Reviewed: 11/06/2007 Exit[employer] Patient Information 2014 [employer].  

## 2012-09-13 ENCOUNTER — Telehealth: Payer: Self-pay | Admitting: *Deleted

## 2012-09-13 NOTE — Telephone Encounter (Signed)
Pt informed with pap results on 09/04/12 visit.

## 2012-09-28 ENCOUNTER — Encounter: Payer: Self-pay | Admitting: Gynecology

## 2012-09-28 ENCOUNTER — Ambulatory Visit: Payer: BC Managed Care – PPO

## 2013-08-15 ENCOUNTER — Other Ambulatory Visit: Payer: Self-pay

## 2013-08-15 DIAGNOSIS — Z1231 Encounter for screening mammogram for malignant neoplasm of breast: Secondary | ICD-10-CM

## 2013-09-05 ENCOUNTER — Encounter: Payer: BC Managed Care – PPO | Admitting: Women's Health

## 2013-09-07 ENCOUNTER — Ambulatory Visit: Payer: BC Managed Care – PPO

## 2013-09-07 ENCOUNTER — Ambulatory Visit
Admission: RE | Admit: 2013-09-07 | Discharge: 2013-09-07 | Disposition: A | Discharge status: Home / Self Care | Payer: BC Managed Care – PPO | Source: Ambulatory Visit

## 2013-09-07 ENCOUNTER — Other Ambulatory Visit (HOSPITAL_COMMUNITY)
Admission: RE | Admit: 2013-09-07 | Discharge: 2013-09-07 | Disposition: A | Discharge status: Home / Self Care | Payer: BC Managed Care – PPO | Source: Ambulatory Visit | Attending: Women's Health | Admitting: Women's Health

## 2013-09-07 ENCOUNTER — Encounter: Payer: Self-pay | Admitting: Women's Health

## 2013-09-07 ENCOUNTER — Ambulatory Visit (INDEPENDENT_AMBULATORY_CARE_PROVIDER_SITE_OTHER): Payer: BC Managed Care – PPO | Admitting: Women's Health

## 2013-09-07 VITALS — BP 119/76 | Ht 61.0 in | Wt 172.4 lb

## 2013-09-07 DIAGNOSIS — Z1231 Encounter for screening mammogram for malignant neoplasm of breast: Secondary | ICD-10-CM

## 2013-09-07 DIAGNOSIS — Z1151 Encounter for screening for human papillomavirus (HPV): Secondary | ICD-10-CM | POA: Insufficient documentation

## 2013-09-07 DIAGNOSIS — Z01419 Encounter for gynecological examination (general) (routine) without abnormal findings: Secondary | ICD-10-CM | POA: Insufficient documentation

## 2013-09-07 DIAGNOSIS — Z113 Encounter for screening for infections with a predominantly sexual mode of transmission: Secondary | ICD-10-CM

## 2013-09-07 NOTE — Addendum Note (Signed)
Addended by: Thamas Jaegers on: 09/07/2013 03:22 PM   Modules accepted: Orders

## 2013-09-07 NOTE — Patient Instructions (Signed)
Health Recommendations for Postmenopausal Women Respected and ongoing research has looked at the most common causes of death, disability, and poor quality of life in postmenopausal women. The causes include heart disease, diseases of blood vessels, diabetes, depression, cancer, and bone loss (osteoporosis). Many things can be done to help lower the chances of developing these and other common problems. CARDIOVASCULAR DISEASE Heart Disease: A heart attack is a medical emergency. Know the signs and symptoms of a heart attack. Below are things women can do to reduce their risk for heart disease.   Do not smoke. If you smoke, quit.  Aim for a healthy weight. Being overweight causes many preventable deaths. Eat a healthy and balanced diet and drink an adequate amount of liquids.  Get moving. Make a commitment to be more physically active. Aim for 30 minutes of activity on most, if not all days of the week.  Eat for heart health. Choose a diet that is low in saturated fat and cholesterol and eliminate trans fat. Include whole grains, vegetables, and fruits. Read and understand the labels on food containers before buying.  Know your numbers. Ask your caregiver to check your blood pressure, cholesterol (total, HDL, LDL, triglycerides) and blood glucose. Work with your caregiver on improving your entire clinical picture.  High blood pressure. Limit or stop your table salt intake (try salt substitute and food seasonings). Avoid salty foods and drinks. Read labels on food containers before buying. Eating well and exercising can help control high blood pressure. STROKE  Stroke is a medical emergency. Stroke may be the result of a blood clot in a blood vessel in the brain or by a brain hemorrhage (bleeding). Know the signs and symptoms of a stroke. To lower the risk of developing a stroke:  Avoid fatty foods.  Quit smoking.  Control your diabetes, blood pressure, and irregular heart rate. THROMBOPHLEBITIS  (BLOOD CLOT) OF THE LEG  Becoming overweight and leading a stationary lifestyle may also contribute to developing blood clots. Controlling your diet and exercising will help lower the risk of developing blood clots. CANCER SCREENING  Breast Cancer: Take steps to reduce your risk of breast cancer.  You should practice "breast self-awareness." This means understanding the normal appearance and feel of your breasts and should include breast self-examination. Any changes detected, no matter how small, should be reported to your caregiver.  After age 40, you should have a clinical breast exam (CBE) every year.  Starting at age 40, you should consider having a mammogram (breast X-ray) every year.  If you have a family history of breast cancer, talk to your caregiver about genetic screening.  If you are at high risk for breast cancer, talk to your caregiver about having an MRI and a mammogram every year.  Intestinal or Stomach Cancer: Tests to consider are a rectal exam, fecal occult blood, sigmoidoscopy, and colonoscopy. Women who are high risk may need to be screened at an earlier age and more often.  Cervical Cancer:  Beginning at age 30, you should have a Pap test every 3 years as long as the past 3 Pap tests have been normal.  If you have had past treatment for cervical cancer or a condition that could lead to cancer, you need Pap tests and screening for cancer for at least 20 years after your treatment.  If you had a hysterectomy for a problem that was not cancer or a condition that could lead to cancer, then you no longer need Pap tests.    If you are between ages 65 and 70, and you have had normal Pap tests going back 10 years, you no longer need Pap tests.  If Pap tests have been discontinued, risk factors (such as a new sexual partner) need to be reassessed to determine if screening should be resumed.  Some medical problems can increase the chance of getting cervical cancer. In these  cases, your caregiver may recommend more frequent screening and Pap tests.  Uterine Cancer: If you have vaginal bleeding after reaching menopause, you should notify your caregiver.  Ovarian Cancer: Other than yearly pelvic exams, there are no reliable tests available to screen for ovarian cancer at this time except for yearly pelvic exams.  Lung Cancer: Yearly chest X-rays can detect lung cancer and should be done on high risk women, such as cigarette smokers and women with chronic lung disease (emphysema).  Skin Cancer: A complete body skin exam should be done at your yearly examination. Avoid overexposure to the sun and ultraviolet light lamps. Use a strong sun block cream when in the sun. All of these things are important for lowering the risk of skin cancer. MENOPAUSE Menopause Symptoms: Hormone therapy products are effective for treating symptoms associated with menopause:  Moderate to severe hot flashes.  Night sweats.  Mood swings.  Headaches.  Tiredness.  Loss of sex drive.  Insomnia.  Other symptoms. Hormone replacement carries certain risks, especially in older women. Women who use or are thinking about using estrogen or estrogen with progestin treatments should discuss that with their caregiver. Your caregiver will help you understand the benefits and risks. The ideal dose of hormone replacement therapy is not known. The Food and Drug Administration (FDA) has concluded that hormone therapy should be used only at the lowest doses and for the shortest amount of time to reach treatment goals.  OSTEOPOROSIS Protecting Against Bone Loss and Preventing Fracture If you use hormone therapy for prevention of bone loss (osteoporosis), the risks for bone loss must outweigh the risk of the therapy. Ask your caregiver about other medications known to be safe and effective for preventing bone loss and fractures. To guard against bone loss or fractures, the following is recommended:  If  you are younger than age 50, take 1000 mg of calcium and at least 600 mg of Vitamin D per day.  If you are older than age 50 but younger than age 70, take 1200 mg of calcium and at least 600 mg of Vitamin D per day.  If you are older than age 70, take 1200 mg of calcium and at least 800 mg of Vitamin D per day. Smoking and excessive alcohol intake increases the risk of osteoporosis. Eat foods rich in calcium and vitamin D and do weight bearing exercises several times a week as your caregiver suggests. DIABETES Diabetes Mellitus: If you have type I or type 2 diabetes, you should keep your blood sugar under control with diet, exercise, and recommended medication. Avoid starchy and fatty foods, and too many sweets. Being overweight can make diabetes control more difficult. COGNITION AND MEMORY Cognition and Memory: Menopausal hormone therapy is not recommended for the prevention of cognitive disorders such as Alzheimer's disease or memory loss.  DEPRESSION  Depression may occur at any age, but it is common in elderly women. This may be because of physical, medical, social (loneliness), or financial problems and needs. If you are experiencing depression because of medical problems and control of symptoms, talk to your caregiver about this. Physical   activity and exercise may help with mood and sleep. Community and volunteer involvement may improve your sense of value and worth. If you have depression and you feel that the problem is getting worse or becoming severe, talk to your caregiver about which treatment options are best for you. ACCIDENTS  Accidents are common and can be serious in elderly woman. Prepare your house to prevent accidents. Eliminate throw rugs, place hand bars in bath, shower, and toilet areas. Avoid wearing high heeled shoes or walking on wet, snowy, and icy areas. Limit or stop driving if you have vision or hearing problems, or if you feel you are unsteady with your movements and  reflexes. HEPATITIS C Hepatitis C is a type of viral infection affecting the liver. It is spread mainly through contact with blood from an infected person. It can be treated, but if left untreated, it can lead to severe liver damage over the years. Many people who are infected do not know that the virus is in their blood. If you are a "baby-boomer", it is recommended that you have one screening test for Hepatitis C. IMMUNIZATIONS  Several immunizations are important to consider having during your senior years, including:   Tetanus, diphtheria, and pertussis booster shot.  Influenza every year before the flu season begins.  Pneumonia vaccine.  Shingles vaccine.  Others, as indicated based on your specific needs. Talk to your caregiver about these. Document Released: 03/12/2005 Document Revised: 06/04/2013 Document Reviewed: 11/06/2007 [employer] Patient Information 2015 [employer]. This information is not intended to replace advice given to you by your health care provider. Make sure you discuss any questions you have with your health care provider.  

## 2013-09-07 NOTE — Progress Notes (Signed)
Briana Griffin [place] Feb 24, 1960 [id number]    History:    Presents for annual exam.  Postmenopausal/no bleeding/no HRT. Abnormal Pap greater than 20 years ago normal Paps since. Normal mammogram history, normal after ultrasound 2014. 2009 benign endometrial polyp. 2013 negative colonoscopy. Primary care manages anxiety and depression with Effexor and occasional xanax. Reports normal DEXA at primary care 2013  Past medical history, past surgical history, family history and social history were all reviewed and documented in the EPIC chart. Has been helping to care for aging parents, father died, mother with dementia having issues with sister and 26 yo nephew who moved back into the family home also. Has been separated from her husband greater than 2 years, in a new relationship. Has a grandson [name].  ROS:  A  12 point ROS was performed and pertinent positives and negatives are included.  Exam:  Filed Vitals:   09/07/13 1428  BP: 119/76    General appearance:  Normal Thyroid:  Symmetrical, normal in size, without palpable masses or nodularity. Respiratory  Auscultation:  Clear without wheezing or rhonchi Cardiovascular  Auscultation:  Regular rate, without rubs, murmurs or gallops  Edema/varicosities:  Not grossly evident Abdominal  Soft,nontender, without masses, guarding or rebound.  Liver/spleen:  No organomegaly noted  Hernia:  None appreciated  Skin  Inspection:  Grossly normal   Breasts: Examined lying and sitting.     Right: Without masses, retractions, discharge or axillary adenopathy.     Left: Without masses, retractions, discharge or axillary adenopathy. Gentitourinary   Inguinal/mons:  Normal without inguinal adenopathy  External genitalia:  Normal  BUS/Urethra/Skene's glands:  Normal  Vagina:  Normal  Cervix:  Normal  Uterus:   normal in size, shape and contour.  Midline and mobile  Adnexa/parametria:     Rt: Without masses or tenderness.   Lt: Without masses or  tenderness.  Anus and perineum: Normal  Digital rectal exam: Normal sphincter tone without palpated masses or tenderness  Assessment/Plan:  53 y.o. DWF G1P1 for annual exam.    Anxiety/depression-primary care manages labs and meds Postmenopausal/no HRT/no bleeding STD screen  Plan: SBE's, continue annual mammogram, calcium rich diet, vitamin D 1000 encouraged. Reviewed importance of regular exercise, continued weight loss for health (down 20 pds). GC/Chlamydia, HIV, hep B, C., RPR. Pap with HR HPV typing. New Pap screening guidelines reviewed.  Note: This dictation was prepared with Dragon/digital dictation.  Any transcriptional errors that result are unintentional. Huel Cote [facility], 2:55 PM 09/07/2013

## 2013-09-08 LAB — GC/CHLAMYDIA PROBE AMP
CT PROBE, AMP APTIMA: NEGATIVE
GC PROBE AMP APTIMA: NEGATIVE

## 2013-09-08 LAB — RPR

## 2013-09-08 LAB — HIV ANTIBODY (ROUTINE TESTING W REFLEX): HIV 1&2 Ab, 4th Generation: NONREACTIVE

## 2013-09-08 LAB — HEPATITIS B SURFACE ANTIGEN: HEP B S AG: NEGATIVE

## 2013-09-08 LAB — HEPATITIS C ANTIBODY: HCV AB: NEGATIVE

## 2013-09-11 LAB — CYTOLOGY - PAP

## 2013-12-03 ENCOUNTER — Encounter: Payer: Self-pay | Admitting: Women's Health

## 2014-01-15 ENCOUNTER — Emergency Department: Payer: Self-pay | Admitting: Emergency Medicine

## 2014-01-15 LAB — COMPREHENSIVE METABOLIC PANEL
ALBUMIN: 3.9 g/dL (ref 3.4–5.0)
ALK PHOS: 65 U/L
ALT: 25 U/L
AST: 26 U/L (ref 15–37)
Anion Gap: 6 — ABNORMAL LOW (ref 7–16)
BILIRUBIN TOTAL: 0.9 mg/dL (ref 0.2–1.0)
BUN: 9 mg/dL (ref 7–18)
CALCIUM: 9.1 mg/dL (ref 8.5–10.1)
CHLORIDE: 107 mmol/L (ref 98–107)
CO2: 28 mmol/L (ref 21–32)
CREATININE: 0.83 mg/dL (ref 0.60–1.30)
EGFR (African American): >60
Glucose: 130 mg/dL — ABNORMAL HIGH (ref 65–99)
Osmolality: 282 (ref 275–301)
POTASSIUM: 3.6 mmol/L (ref 3.5–5.1)
Sodium: 141 mmol/L (ref 136–145)
TOTAL PROTEIN: 7.3 g/dL (ref 6.4–8.2)

## 2014-01-15 LAB — CBC
HCT: 47.5 % — AB (ref 35.0–47.0)
HGB: 15.2 g/dL (ref 12.0–16.0)
MCH: 29.6 pg (ref 26.0–34.0)
MCHC: 32 g/dL (ref 32.0–36.0)
MCV: 92 fL (ref 80–100)
Platelet: 214 10*3/uL (ref 150–440)
RBC: 5.14 10*6/uL (ref 3.80–5.20)
RDW: 13.5 % (ref 11.5–14.5)
WBC: 7.3 10*3/uL (ref 3.6–11.0)

## 2014-08-08 ENCOUNTER — Other Ambulatory Visit: Payer: Self-pay

## 2014-08-08 DIAGNOSIS — Z1231 Encounter for screening mammogram for malignant neoplasm of breast: Secondary | ICD-10-CM

## 2014-08-23 ENCOUNTER — Encounter: Payer: Self-pay | Admitting: Women's Health

## 2014-08-23 ENCOUNTER — Ambulatory Visit (INDEPENDENT_AMBULATORY_CARE_PROVIDER_SITE_OTHER): Payer: 59 | Admitting: Women's Health

## 2014-08-23 VITALS — BP 122/84 | Ht 61.0 in | Wt 156.0 lb

## 2014-08-23 DIAGNOSIS — Z113 Encounter for screening for infections with a predominantly sexual mode of transmission: Secondary | ICD-10-CM | POA: Diagnosis not present

## 2014-08-23 DIAGNOSIS — Z13220 Encounter for screening for lipoid disorders: Secondary | ICD-10-CM | POA: Diagnosis not present

## 2014-08-23 DIAGNOSIS — Z01419 Encounter for gynecological examination (general) (routine) without abnormal findings: Secondary | ICD-10-CM

## 2014-08-23 DIAGNOSIS — F4322 Adjustment disorder with anxiety: Secondary | ICD-10-CM | POA: Diagnosis not present

## 2014-08-23 DIAGNOSIS — F4323 Adjustment disorder with mixed anxiety and depressed mood: Secondary | ICD-10-CM | POA: Insufficient documentation

## 2014-08-23 LAB — COMPREHENSIVE METABOLIC PANEL
ALT: 9 U/L (ref 0–35)
AST: 16 U/L (ref 0–37)
Albumin: 4.2 g/dL (ref 3.5–5.2)
Alkaline Phosphatase: 35 U/L — ABNORMAL LOW (ref 39–117)
BUN: 12 mg/dL (ref 6–23)
CALCIUM: 9.1 mg/dL (ref 8.4–10.5)
CO2: 29 mEq/L (ref 19–32)
Chloride: 104 mEq/L (ref 96–112)
Creat: 0.75 mg/dL (ref 0.50–1.10)
Glucose, Bld: 98 mg/dL (ref 70–99)
Potassium: 3.8 mEq/L (ref 3.5–5.3)
Sodium: 143 mEq/L (ref 135–145)
Total Bilirubin: 1 mg/dL (ref 0.2–1.2)
Total Protein: 6.5 g/dL (ref 6.0–8.3)

## 2014-08-23 LAB — CBC WITH DIFFERENTIAL/PLATELET
Basophils Absolute: 0 10*3/uL (ref 0.0–0.1)
Basophils Relative: 0 % (ref 0–1)
EOS ABS: 0.1 10*3/uL (ref 0.0–0.7)
Eosinophils Relative: 1 % (ref 0–5)
HEMATOCRIT: 41.5 % (ref 36.0–46.0)
HEMOGLOBIN: 14.3 g/dL (ref 12.0–15.0)
LYMPHS ABS: 2.1 10*3/uL (ref 0.7–4.0)
LYMPHS PCT: 41 % (ref 12–46)
MCH: 30.5 pg (ref 26.0–34.0)
MCHC: 34.5 g/dL (ref 30.0–36.0)
MCV: 88.5 fL (ref 78.0–100.0)
MONOS PCT: 7 % (ref 3–12)
MPV: 11.5 fL (ref 8.6–12.4)
Monocytes Absolute: 0.4 10*3/uL (ref 0.1–1.0)
Neutro Abs: 2.6 10*3/uL (ref 1.7–7.7)
Neutrophils Relative %: 51 % (ref 43–77)
Platelets: 203 10*3/uL (ref 150–400)
RBC: 4.69 MIL/uL (ref 3.87–5.11)
RDW: 13.7 % (ref 11.5–15.5)
WBC: 5 10*3/uL (ref 4.0–10.5)

## 2014-08-23 LAB — LIPID PANEL
CHOLESTEROL: 153 mg/dL (ref 0–200)
HDL: 66 mg/dL (ref >=46)
LDL Cholesterol: 77 mg/dL (ref 0–99)
Total CHOL/HDL Ratio: 2.3 Ratio
Triglycerides: 51 mg/dL (ref <150)
VLDL: 10 mg/dL (ref 0–40)

## 2014-08-23 MED ORDER — ESCITALOPRAM OXALATE 20 MG PO TABS: 20.0000 mg | ORAL_TABLET | Freq: Every day | ORAL | Status: DC

## 2014-08-23 NOTE — Progress Notes (Signed)
Briana Griffin 11/22/1960 [id number]    History:    Presents for annual exam.  Postmenopausal/no HRT/no bleeding. Same partner, good relationship. 2009 benign endometrial polyp. 2013 negative colonoscopy. Normal Pap and mammogram history. Primary care treating anxiety and depression currently on Paxil states would like to try a different medication. Has had counseling in the past. Numerous situational stressors losing job, caring for mother with Alzheimer's, sister recently died from bone cancer, stress relationship with son. Denies feelings of harming self, states has always had mild anxiety. Normal DEXA per primary care.  Past medical history, past surgical history, family history and social history were all reviewed and documented in the EPIC chart. Has lost 15 pounds in the past year with diet/ exercise and stress.  ROS:  A ROS was performed and pertinent positives and negatives are included.  Exam:  Filed Vitals:   08/23/14 1523  BP: 122/84    General appearance:  Normal Thyroid:  Symmetrical, normal in size, without palpable masses or nodularity. Respiratory  Auscultation:  Clear without wheezing or rhonchi Cardiovascular  Auscultation:  Regular rate, without rubs, murmurs or gallops  Edema/varicosities:  Not grossly evident Abdominal  Soft,nontender, without masses, guarding or rebound.  Liver/spleen:  No organomegaly noted  Hernia:  None appreciated  Skin  Inspection:  Grossly normal   Breasts: Examined lying and sitting.     Right: Without masses, retractions, discharge or axillary adenopathy.     Left: Without masses, retractions, discharge or axillary adenopathy. Gentitourinary   Inguinal/mons:  Normal without inguinal adenopathy  External genitalia:  Normal  BUS/Urethra/Skene's glands:  Normal  Vagina:  Normal  Cervix:  Normal  Uterus:  normal in size, shape and contour.  Midline and mobile  Adnexa/parametria:     Rt: Without masses or tenderness.   Lt: Without  masses or tenderness.  Anus and perineum: Normal  Digital rectal exam: Normal sphincter tone without palpated masses or tenderness  Assessment/Plan:  54 y.o. separated WF G1 P1 for annual exam.    Postmenopausal/no HRT/no bleeding Anxiety/depression  Plan: SBE's, genuine annual screening mammogram, scheduled, regular exercise, calcium rich diet, vitamin D per primary care. States WBCs were slightly low will recheck CBC, CMP, lipid panel, UA, vitamin D, Pap normal 2015 with negative HR HPV new screening guidelines reviewed. GC/Chlamydia culture taken per request denies need for HIV, hepatitis or RPR. Long discussion on need for counseling strongly encouraged. Will try Lexapro 20 mg will start with 10 mg daily increase to 20 after several weeks. Reviewed importance of continued exercise, leisure activities, delegating care of mother as able. Call if no relief.    Huel Cote [facility], 4:23 PM 08/23/2014

## 2014-08-23 NOTE — Patient Instructions (Signed)
Generalized Anxiety Disorder Generalized anxiety disorder (GAD) is a mental disorder. It interferes with life functions, including relationships, work, and school. GAD is different from normal anxiety, which everyone experiences at some point in their lives in response to specific life events and activities. Normal anxiety actually helps [place] prepare for and get through these life events and activities. Normal anxiety goes away after the event or activity is over.  GAD causes anxiety that is not necessarily related to specific events or activities. It also causes excess anxiety in proportion to specific events or activities. The anxiety associated with GAD is also difficult to control. GAD can vary from mild to severe. People with severe GAD can have intense waves of anxiety with physical symptoms (panic attacks).  SYMPTOMS The anxiety and worry associated with GAD are difficult to control. This anxiety and worry are related to many life events and activities and also occur more days than not for 6 months or longer. People with GAD also have three or more of the following symptoms (one or more in children):  Restlessness.   Fatigue.  Difficulty concentrating.   Irritability.  Muscle tension.  Difficulty sleeping or unsatisfying sleep. DIAGNOSIS GAD is diagnosed through an assessment by your health care provider. Your health care provider will ask you questions aboutyour mood,physical symptoms, and events in your life. Your health care provider may ask you about your medical history and use of alcohol or drugs, including prescription medicines. Your health care provider may also do a physical exam and blood tests. Certain medical conditions and the use of certain substances can cause symptoms similar to those associated with GAD. Your health care provider may refer you to a mental health specialist for further evaluation. TREATMENT The following therapies are usually used to treat GAD:    Medication. Antidepressant medication usually is prescribed for long-term daily control. Antianxiety medicines may be added in severe cases, especially when panic attacks occur.   Talk therapy (psychotherapy). Certain types of talk therapy can be helpful in treating GAD by providing support, education, and guidance. A form of talk therapy called cognitive behavioral therapy can teach you healthy ways to think about and react to daily life events and activities.  Stress managementtechniques. These include yoga, meditation, and exercise and can be very helpful when they are practiced regularly. A mental health specialist can help determine which treatment is best for you. Some people see improvement with one therapy. However, other people require a combination of therapies. Document Released: 05/15/2012 Document Revised: 06/04/2013 Document Reviewed: 05/15/2012 [facility] Patient Information 2015 [place], [place]. This information is not intended to replace advice given to you by your health care provider. Make sure you discuss any questions you have with your health care provider. Health Recommendations for Postmenopausal Women Respected and ongoing research has looked at the most common causes of death, disability, and poor quality of life in postmenopausal women. The causes include heart disease, diseases of blood vessels, diabetes, depression, cancer, and bone loss (osteoporosis). Many things can be done to help lower the chances of developing these and other common problems. CARDIOVASCULAR DISEASE Heart Disease: A heart attack is a medical emergency. Know the signs and symptoms of a heart attack. Below are things women can do to reduce their risk for heart disease.   Do not smoke. If you smoke, quit.  Aim for a healthy weight. Being overweight causes many preventable deaths. Eat a healthy and balanced diet and drink an adequate amount of liquids.  Get moving.  Make a commitment to be more  physically active. Aim for 30 minutes of activity on most, if not all days of the week.  Eat for heart health. Choose a diet that is low in saturated fat and cholesterol and eliminate trans fat. Include whole grains, vegetables, and fruits. Read and understand the labels on food containers before buying.  Know your numbers. Ask your caregiver to check your blood pressure, cholesterol (total, HDL, LDL, triglycerides) and blood glucose. Work with your caregiver on improving your entire clinical picture.  High blood pressure. Limit or stop your table salt intake (try salt substitute and food seasonings). Avoid salty foods and drinks. Read labels on food containers before buying. Eating well and exercising can help control high blood pressure. STROKE  Stroke is a medical emergency. Stroke may be the result of a blood clot in a blood vessel in the brain or by a brain hemorrhage (bleeding). Know the signs and symptoms of a stroke. To lower the risk of developing a stroke:  Avoid fatty foods.  Quit smoking.  Control your diabetes, blood pressure, and irregular heart rate. THROMBOPHLEBITIS (BLOOD CLOT) OF THE LEG  Becoming overweight and leading a stationary lifestyle may also contribute to developing blood clots. Controlling your diet and exercising will help lower the risk of developing blood clots. CANCER SCREENING  Breast Cancer: Take steps to reduce your risk of breast cancer.  You should practice "breast self-awareness." This means understanding the normal appearance and feel of your breasts and should include breast self-examination. Any changes detected, no matter how small, should be reported to your caregiver.  After age 71, you should have a clinical breast exam (CBE) every year.  Starting at age 37, you should consider having a mammogram (breast X-ray) every year.  If you have a family history of breast cancer, talk to your caregiver about genetic screening.  If you are at high risk  for breast cancer, talk to your caregiver about having an MRI and a mammogram every year.  Intestinal or Stomach Cancer: Tests to consider are a rectal exam, fecal occult blood, sigmoidoscopy, and colonoscopy. Women who are high risk may need to be screened at an earlier age and more often.  Cervical Cancer:  Beginning at age 20, you should have a Pap test every 3 years as long as the past 3 Pap tests have been normal.  If you have had past treatment for cervical cancer or a condition that could lead to cancer, you need Pap tests and screening for cancer for at least 20 years after your treatment.  If you had a hysterectomy for a problem that was not cancer or a condition that could lead to cancer, then you no longer need Pap tests.  If you are between ages 57 and 103, and you have had normal Pap tests going back 10 years, you no longer need Pap tests.  If Pap tests have been discontinued, risk factors (such as a new sexual partner) need to be reassessed to determine if screening should be resumed.  Some medical problems can increase the chance of getting cervical cancer. In these cases, your caregiver may recommend more frequent screening and Pap tests.  Uterine Cancer: If you have vaginal bleeding after reaching menopause, you should notify your caregiver.  Ovarian Cancer: Other than yearly pelvic exams, there are no reliable tests available to screen for ovarian cancer at this time except for yearly pelvic exams.  Lung Cancer: Yearly chest X-rays can detect lung cancer  and should be done on high risk women, such as cigarette smokers and women with chronic lung disease (emphysema).  Skin Cancer: A complete body skin exam should be done at your yearly examination. Avoid overexposure to the sun and ultraviolet light lamps. Use a strong sun block cream when in the sun. All of these things are important for lowering the risk of skin cancer. MENOPAUSE Menopause Symptoms: Hormone therapy  products are effective for treating symptoms associated with menopause:  Moderate to severe hot flashes.  Night sweats.  Mood swings.  Headaches.  Tiredness.  Loss of sex drive.  Insomnia.  Other symptoms. Hormone replacement carries certain risks, especially in older women. Women who use or are thinking about using estrogen or estrogen with progestin treatments should discuss that with their caregiver. Your caregiver will help you understand the benefits and risks. The ideal dose of hormone replacement therapy is not known. The Food and Drug Administration (FDA) has concluded that hormone therapy should be used only at the lowest doses and for the shortest amount of time to reach treatment goals.  OSTEOPOROSIS Protecting Against Bone Loss and Preventing Fracture If you use hormone therapy for prevention of bone loss (osteoporosis), the risks for bone loss must outweigh the risk of the therapy. Ask your caregiver about other medications known to be safe and effective for preventing bone loss and fractures. To guard against bone loss or fractures, the following is recommended:  If you are younger than age 91, take 1000 mg of calcium and at least 600 mg of Vitamin D per day.  If you are older than age 47 but younger than age 77, take 1200 mg of calcium and at least 600 mg of Vitamin D per day.  If you are older than age 72, take 1200 mg of calcium and at least 800 mg of Vitamin D per day. Smoking and excessive alcohol intake increases the risk of osteoporosis. Eat foods rich in calcium and vitamin D and do weight bearing exercises several times a week as your caregiver suggests. DIABETES Diabetes Mellitus: If you have type I or type 2 diabetes, you should keep your blood sugar under control with diet, exercise, and recommended medication. Avoid starchy and fatty foods, and too many sweets. Being overweight can make diabetes control more difficult. COGNITION AND MEMORY Cognition and  Memory: Menopausal hormone therapy is not recommended for the prevention of cognitive disorders such as Alzheimer's disease or memory loss.  DEPRESSION  Depression may occur at any age, but it is common in elderly women. This may be because of physical, medical, social (loneliness), or financial problems and needs. If you are experiencing depression because of medical problems and control of symptoms, talk to your caregiver about this. Physical activity and exercise may help with mood and sleep. Community and volunteer involvement may improve your sense of value and worth. If you have depression and you feel that the problem is getting worse or becoming severe, talk to your caregiver about which treatment options are best for you. ACCIDENTS  Accidents are common and can be serious in elderly woman. Prepare your house to prevent accidents. Eliminate throw rugs, place hand bars in bath, shower, and toilet areas. Avoid wearing high heeled shoes or walking on wet, snowy, and icy areas. Limit or stop driving if you have vision or hearing problems, or if you feel you are unsteady with your movements and reflexes. HEPATITIS C Hepatitis C is a type of viral infection affecting the liver. It  is spread mainly through contact with blood from an infected person. It can be treated, but if left untreated, it can lead to severe liver damage over the years. Many people who are infected do not know that the virus is in their blood. If you are a "baby-boomer", it is recommended that you have one screening test for Hepatitis C. IMMUNIZATIONS  Several immunizations are important to consider having during your senior years, including:   Tetanus, diphtheria, and pertussis booster shot.  Influenza every year before the flu season begins.  Pneumonia vaccine.  Shingles vaccine.  Others, as indicated based on your specific needs. Talk to your caregiver about these. Document Released: 03/12/2005 Document Revised:  06/04/2013 Document Reviewed: 11/06/2007 [facility] Patient Information 2015 [place], [place]. This information is not intended to replace advice given to you by your health care provider. Make sure you discuss any questions you have with your health care provider.

## 2014-08-24 LAB — URINALYSIS W MICROSCOPIC + REFLEX CULTURE
Bilirubin Urine: NEGATIVE
Casts: NONE SEEN
Crystals: NONE SEEN
Glucose, UA: NEGATIVE mg/dL
HGB URINE DIPSTICK: NEGATIVE
KETONES UR: NEGATIVE mg/dL
Leukocytes, UA: NEGATIVE
NITRITE: NEGATIVE
Protein, ur: NEGATIVE mg/dL
Specific Gravity, Urine: 1.013 (ref 1.005–1.030)
Squamous Epithelial / LPF: NONE SEEN
UROBILINOGEN UA: 0.2 mg/dL (ref 0.0–1.0)
pH: 7 (ref 5.0–8.0)

## 2014-08-24 LAB — GC/CHLAMYDIA PROBE AMP
CT Probe RNA: NEGATIVE
GC PROBE AMP APTIMA: NEGATIVE

## 2014-08-24 LAB — VITAMIN D 25 HYDROXY (VIT D DEFICIENCY, FRACTURES): Vit D, 25-Hydroxy: 35 ng/mL (ref 30–100)

## 2014-08-27 ENCOUNTER — Encounter: Payer: Self-pay | Admitting: *Deleted

## 2014-08-28 ENCOUNTER — Ambulatory Visit: Payer: 59 | Admitting: Anesthesiology

## 2014-08-28 ENCOUNTER — Encounter: Payer: Self-pay | Admitting: Anesthesiology

## 2014-08-28 ENCOUNTER — Encounter
Admission: RE | Disposition: A | Discharge status: Home / Self Care | Payer: Self-pay | Source: Ambulatory Visit | Attending: Unknown Physician Specialty

## 2014-08-28 ENCOUNTER — Ambulatory Visit
Admission: RE | Admit: 2014-08-28 | Discharge: 2014-08-28 | Disposition: A | Discharge status: Home / Self Care | Payer: 59 | Source: Ambulatory Visit | Attending: Unknown Physician Specialty | Admitting: Unknown Physician Specialty

## 2014-08-28 DIAGNOSIS — K648 Other hemorrhoids: Secondary | ICD-10-CM | POA: Insufficient documentation

## 2014-08-28 DIAGNOSIS — R109 Unspecified abdominal pain: Secondary | ICD-10-CM | POA: Diagnosis not present

## 2014-08-28 DIAGNOSIS — D123 Benign neoplasm of transverse colon: Secondary | ICD-10-CM | POA: Diagnosis not present

## 2014-08-28 HISTORY — DX: Iodine-deficiency related diffuse (endemic) goiter: E01.0

## 2014-08-28 HISTORY — DX: Asymptomatic menopausal state: Z78.0

## 2014-08-28 HISTORY — DX: Unspecified hemorrhoids: K64.9

## 2014-08-28 HISTORY — DX: Major depressive disorder, single episode, unspecified: F32.9

## 2014-08-28 HISTORY — PX: COLONOSCOPY WITH PROPOFOL: SHX5780

## 2014-08-28 HISTORY — DX: Palpitations: R00.2

## 2014-08-28 HISTORY — DX: Depression, unspecified: F32.A

## 2014-08-28 HISTORY — DX: Diffuse cystic mastopathy of unspecified breast: N60.19

## 2014-08-28 HISTORY — DX: Vitamin D deficiency, unspecified: E55.9

## 2014-08-28 SURGERY — COLONOSCOPY WITH PROPOFOL
Anesthesia: General

## 2014-08-28 MED ORDER — PROPOFOL INFUSION 10 MG/ML OPTIME
INTRAVENOUS | Status: DC | PRN
Start: 2014-08-28 — End: 2014-08-28
  Administered 2014-08-28: 140 ug/kg/min via INTRAVENOUS

## 2014-08-28 MED ORDER — PROPOFOL 10 MG/ML IV BOLUS
INTRAVENOUS | Status: DC | PRN
  Administered 2014-08-28: 55 mg via INTRAVENOUS

## 2014-08-28 MED ORDER — PHENYLEPHRINE HCL 10 MG/ML IJ SOLN
INTRAMUSCULAR | Status: DC | PRN
  Administered 2014-08-28 (×2): 200 ug via INTRAVENOUS
  Administered 2014-08-28: 100 ug via INTRAVENOUS

## 2014-08-28 MED ORDER — FENTANYL CITRATE (PF) 100 MCG/2ML IJ SOLN
INTRAMUSCULAR | Status: DC | PRN
  Administered 2014-08-28: 50 ug via INTRAVENOUS

## 2014-08-28 MED ORDER — MIDAZOLAM HCL 5 MG/5ML IJ SOLN
INTRAMUSCULAR | Status: DC | PRN
  Administered 2014-08-28: 1 mg via INTRAVENOUS

## 2014-08-28 MED ORDER — SODIUM CHLORIDE 0.9 % IV SOLN: INTRAVENOUS | Status: DC

## 2014-08-28 MED ORDER — SODIUM CHLORIDE 0.9 % IV SOLN
INTRAVENOUS | Status: DC
  Administered 2014-08-28: 12:00:00 via INTRAVENOUS

## 2014-08-28 NOTE — Op Note (Signed)
[facility] Gastroenterology Patient Name: Briana Griffin Procedure Date: 08/28/2014 12:07 PM MRN: [id number] Account #: [id number] Date of Birth: 11/29/1960 Admit Type: Outpatient Age: 54 Room: [facility] ENDO ROOM 4 Gender: Female Note Status: Finalized Procedure:         Colonoscopy Indications:       Periumbilical abdominal pain Providers:         Manya Silvas, MD Referring MD:      Leonie Douglas. Doy Hutching, MD (Referring MD) Medicines:         Propofol per Anesthesia Complications:     No immediate complications. Procedure:         Pre-Anesthesia Assessment:                    - After reviewing the risks and benefits, the patient was                     deemed in satisfactory condition to undergo the procedure.                    After obtaining informed consent, the colonoscope was                     passed under direct vision. Throughout the procedure, the                     patient's blood pressure, pulse, and oxygen saturations                     were monitored continuously. The Colonoscope was                     introduced through the anus and advanced to the the cecum,                     identified by appendiceal orifice and ileocecal valve. The                     colonoscopy was performed without difficulty. The patient                     tolerated the procedure well. The quality of the bowel                     preparation was excellent. Findings:      Internal hemorrhoids were found during endoscopy. The hemorrhoids were       small and Grade I (internal hemorrhoids that do not prolapse).      The colon (entire examined portion) appeared normal. Biopsies were taken       with a cold forceps for histology. Done for previous colitis.      No polyps seen. Impression:        - Internal hemorrhoids.                    - The entire examined colon is normal. Biopsied. Recommendation:    - Await pathology results. Manya Silvas, MD 08/28/2014 12:50:14  PM This report has been signed electronically. Number of Addenda: 0 Note Initiated On: 08/28/2014 12:07 PM Scope Withdrawal Time: 0 hours 17 minutes 33 seconds  Total Procedure Duration: 0 hours 25 minutes 49 seconds       [facility]

## 2014-08-28 NOTE — Transfer of Care (Signed)
Immediate Anesthesia Transfer of Care Note  Patient: Briana Griffin  Procedure(s) Performed: Procedure(s): COLONOSCOPY WITH PROPOFOL (N/A)  Patient Location: PACU and Endoscopy Unit  Anesthesia Type:General  Level of Consciousness: awake, alert  and oriented  Airway & Oxygen Therapy: Patient Spontanous Breathing and Patient connected to nasal cannula oxygen  Post-op Assessment: Report given to RN and Post -op Vital signs reviewed and stable  Post vital signs: Reviewed and stable  Last Vitals:  Filed Vitals:   08/28/14 1255  BP:   Pulse:   Temp: 35.8 C  Resp:     Complications: No apparent anesthesia complications

## 2014-08-28 NOTE — Anesthesia Postprocedure Evaluation (Signed)
  Anesthesia Post-op Note  Patient: Briana Griffin  Procedure(s) Performed: Procedure(s): COLONOSCOPY WITH PROPOFOL (N/A)  Anesthesia type:General  Patient location: PACU  Post pain: Pain level controlled  Post assessment: Post-op Vital signs reviewed, Patient's Cardiovascular Status Stable, Respiratory Function Stable, Patent Airway and No signs of Nausea or vomiting  Post vital signs: Reviewed and stable  Last Vitals:  Filed Vitals:   08/28/14 1320  BP: 94/57  Pulse: 54  Temp:   Resp: 16    Level of consciousness: awake, alert  and patient cooperative  Complications: No apparent anesthesia complications

## 2014-08-28 NOTE — H&P (Signed)
Primary Care Physician:  Idelle Crouch, MD Primary Gastroenterologist:  Dr. Vira Agar  Pre-Procedure History & Physical: HPI:  Briana Griffin is a 54 y.o. female is here for an colonoscopy.   Past Medical History  Diagnosis Date  . Fibroid   . GERD (gastroesophageal reflux disease)   . Endometriosis   . Fibrocystic breast disease   . Palpitations   . Thyromegaly   . Postmenopausal   . Depression   . Hemorrhoids   . Vitamin D deficiency     Past Surgical History  Procedure Laterality Date  . Nasal septum surgery    . Pelvic laparoscopy    . Hysteroscopy  07/2007    D&C  . Breast surgery      right breast mass-benign    Prior to Admission medications   Medication Sig Start Date End Date Taking? Authorizing Provider  escitalopram (LEXAPRO) 20 MG tablet Take 1 tablet (20 mg total) by mouth daily. 08/23/14  Yes Huel Cote, NP  Vitamin D, Ergocalciferol, (DRISDOL) 50000 UNITS CAPS Take 50,000 Units by mouth every 7 (seven) days.   Yes Historical Provider, MD  ALPRAZolam Duanne Moron) 0.25 MG tablet Take 0.25 mg by mouth at bedtime as needed for anxiety.    Historical Provider, MD  OMEPRAZOLE PO Take by mouth.    Historical Provider, MD  PARoxetine (PAXIL) 20 MG tablet Take 1 tablet by mouth daily. 08/09/14 08/09/15  Historical Provider, MD    Allergies as of 08/26/2014 - Review Complete 08/23/2014  Allergen Reaction Noted  . Penicillins  04/29/2011  . Sulfa antibiotics Rash 09/07/2013    Family History  Problem Relation Age of Onset  . Diabetes Mother   . Heart disease Sister   . Thyroid cancer Sister   . Bone cancer Sister   . Cancer Maternal Aunt     uterine  . Breast cancer Paternal Aunt     Age 42's  . Breast cancer Cousin     maternal-Age 11's  . Cancer Maternal Aunt     stomach cancer  . COPD Father   . Heart failure Father     History   Social History  . Marital Status: Legally Separated    Spouse Name: N/A  . Number of Children: N/A  . Years of  Education: N/A   Occupational History  . Not on file.   Social History Main Topics  . Smoking status: Former Research scientist (life sciences)  . Smokeless tobacco: Not on file  . Alcohol Use: 1.0 oz/week    2 drink(s) per week  . Drug Use: No  . Sexual Activity: Yes    Birth Control/ Protection: Post-menopausal   Other Topics Concern  . Not on file   Social History Narrative    Review of Systems: See HPI, otherwise negative ROS  Physical Exam: BP 124/67 mmHg  Pulse 65  Temp(Src) 99.4 F (37.4 C) (Tympanic)  Resp 17  Ht 5\' 2"  (1.575 m)  Wt 69.854 kg (154 lb)  BMI 28.16 kg/m2  SpO2 100% General:   Alert,  pleasant and cooperative in NAD Head:  Normocephalic and atraumatic. Neck:  Supple; no masses or thyromegaly. Lungs:  Clear throughout to auscultation.    Heart:  Regular rate and rhythm. Abdomen:  Soft, nontender and nondistended. Normal bowel sounds, without guarding, and without rebound.   Neurologic:  Alert and  oriented x4;  grossly normal neurologically.  Impression/Plan: Briana Griffin is here for an colonoscopy to be performed for abd pain  Risks,  benefits, limitations, and alternatives regarding  colonoscopy have been reviewed with the patient.  Questions have been answered.  All parties agreeable.   Gaylyn Cheers, MD  08/28/2014, 12:13 PM   Primary Care Physician:  Idelle Crouch, MD Primary Gastroenterologist:  Dr. Vira Agar  Pre-Procedure History & Physical: HPI:  Briana Griffin is a 53 y.o. female is here for an colonoscopy.   Past Medical History  Diagnosis Date  . Fibroid   . GERD (gastroesophageal reflux disease)   . Endometriosis   . Fibrocystic breast disease   . Palpitations   . Thyromegaly   . Postmenopausal   . Depression   . Hemorrhoids   . Vitamin D deficiency     Past Surgical History  Procedure Laterality Date  . Nasal septum surgery    . Pelvic laparoscopy    . Hysteroscopy  07/2007    D&C  . Breast surgery      right breast mass-benign     Prior to Admission medications   Medication Sig Start Date End Date Taking? Authorizing Provider  escitalopram (LEXAPRO) 20 MG tablet Take 1 tablet (20 mg total) by mouth daily. 08/23/14  Yes Huel Cote, NP  Vitamin D, Ergocalciferol, (DRISDOL) 50000 UNITS CAPS Take 50,000 Units by mouth every 7 (seven) days.   Yes Historical Provider, MD  ALPRAZolam Duanne Moron) 0.25 MG tablet Take 0.25 mg by mouth at bedtime as needed for anxiety.    Historical Provider, MD  OMEPRAZOLE PO Take by mouth.    Historical Provider, MD  PARoxetine (PAXIL) 20 MG tablet Take 1 tablet by mouth daily. 08/09/14 08/09/15  Historical Provider, MD    Allergies as of 08/26/2014 - Review Complete 08/23/2014  Allergen Reaction Noted  . Penicillins  04/29/2011  . Sulfa antibiotics Rash 09/07/2013    Family History  Problem Relation Age of Onset  . Diabetes Mother   . Heart disease Sister   . Thyroid cancer Sister   . Bone cancer Sister   . Cancer Maternal Aunt     uterine  . Breast cancer Paternal Aunt     Age 58's  . Breast cancer Cousin     maternal-Age 56's  . Cancer Maternal Aunt     stomach cancer  . COPD Father   . Heart failure Father     History   Social History  . Marital Status: Legally Separated    Spouse Name: N/A  . Number of Children: N/A  . Years of Education: N/A   Occupational History  . Not on file.   Social History Main Topics  . Smoking status: Former Research scientist (life sciences)  . Smokeless tobacco: Not on file  . Alcohol Use: 1.0 oz/week    2 drink(s) per week  . Drug Use: No  . Sexual Activity: Yes    Birth Control/ Protection: Post-menopausal   Other Topics Concern  . Not on file   Social History Narrative    Review of Systems: See HPI, otherwise negative ROS  Physical Exam: BP 124/67 mmHg  Pulse 65  Temp(Src) 99.4 F (37.4 C) (Tympanic)  Resp 17  Ht 5\' 2"  (1.575 m)  Wt 69.854 kg (154 lb)  BMI 28.16 kg/m2  SpO2 100% General:   Alert,  pleasant and cooperative in NAD Head:   Normocephalic and atraumatic. Neck:  Supple; no masses or thyromegaly. Lungs:  Clear throughout to auscultation.    Heart:  Regular rate and rhythm. Abdomen:  Soft, nontender and nondistended. Normal bowel sounds, without guarding, and  without rebound.   Neurologic:  Alert and  oriented x4;  grossly normal neurologically.  Impression/Plan: Briana Griffin is here for an colonoscopy to be performed for periumbilical pain  Risks, benefits, limitations, and alternatives regarding  colonoscopy have been reviewed with the patient.  Questions have been answered.  All parties agreeable.   Gaylyn Cheers, MD  08/28/2014, 12:13 PM

## 2014-08-28 NOTE — Anesthesia Preprocedure Evaluation (Signed)
Anesthesia Evaluation  Patient identified by MRN, date of birth, ID band Patient awake    Reviewed: Allergy & Precautions, NPO status , Patient's Chart, lab work & pertinent test results  Airway Mallampati: III  TM Distance: >3 FB Neck ROM: Full    Dental  (+) Caps Left upper molar cap:   Pulmonary former smoker,  breath sounds clear to auscultation  Pulmonary exam normal       Cardiovascular negative cardio ROS Normal cardiovascular exam    Neuro/Psych PSYCHIATRIC DISORDERS Anxiety Depression    GI/Hepatic Neg liver ROS, GERD-  Medicated and Controlled,  Endo/Other  negative endocrine ROS  Renal/GU negative Renal ROS  negative genitourinary   Musculoskeletal negative musculoskeletal ROS (+)   Abdominal Normal abdominal exam  (+)   Peds negative pediatric ROS (+)  Hematology negative hematology ROS (+)   Anesthesia Other Findings   Reproductive/Obstetrics negative OB ROS                             Anesthesia Physical Anesthesia Plan  ASA: II  Anesthesia Plan: General   Post-op Pain Management:    Induction: Intravenous  Airway Management Planned: Nasal Cannula  Additional Equipment:   Intra-op Plan:   Post-operative Plan:   Informed Consent: I have reviewed the patients History and Physical, chart, labs and discussed the procedure including the risks, benefits and alternatives for the proposed anesthesia with the patient or authorized representative who has indicated his/her understanding and acceptance.   Dental advisory given  Plan Discussed with: CRNA and Surgeon  Anesthesia Plan Comments:         Anesthesia Quick Evaluation

## 2014-08-28 NOTE — Anesthesia Procedure Notes (Signed)
Date/Time: 08/28/2014 12:19 PM Performed by: Kennon Holter Pre-anesthesia Checklist: Timeout performed, Patient being monitored, Suction available, Emergency Drugs available and Patient identified Patient Re-evaluated:Patient Re-evaluated prior to inductionOxygen Delivery Method: Nasal cannula Intubation Type: IV induction

## 2014-08-30 ENCOUNTER — Encounter: Payer: Self-pay | Admitting: Unknown Physician Specialty

## 2014-08-30 LAB — SURGICAL PATHOLOGY

## 2014-09-06 ENCOUNTER — Encounter: Payer: Self-pay | Admitting: Women's Health

## 2014-09-11 ENCOUNTER — Encounter: Payer: Self-pay | Admitting: Women's Health

## 2014-09-11 ENCOUNTER — Ambulatory Visit: Payer: Self-pay

## 2014-09-18 ENCOUNTER — Telehealth: Payer: Self-pay

## 2014-09-18 NOTE — Telephone Encounter (Signed)
Patient said her GI MD has recommended she "have Hep A and B test done as preventative due to scar on liver".  Patient wanted to know if [place] had ordered it here as they told her if we had done it in the last 5 years she would not have to have it drawn again. I advised her that last August [place] checked Hep B surface antigen and it was negative as was Hep C.  We did not check Hep A.

## 2014-09-30 ENCOUNTER — Other Ambulatory Visit: Payer: Self-pay

## 2014-09-30 DIAGNOSIS — Z1231 Encounter for screening mammogram for malignant neoplasm of breast: Secondary | ICD-10-CM

## 2014-10-01 ENCOUNTER — Ambulatory Visit
Admission: RE | Admit: 2014-10-01 | Discharge: 2014-10-01 | Disposition: A | Discharge status: Home / Self Care | Payer: Self-pay | Source: Ambulatory Visit

## 2014-10-01 ENCOUNTER — Other Ambulatory Visit: Payer: Self-pay | Admitting: Internal Medicine

## 2014-10-01 DIAGNOSIS — Z1231 Encounter for screening mammogram for malignant neoplasm of breast: Secondary | ICD-10-CM

## 2014-10-01 DIAGNOSIS — M542 Cervicalgia: Secondary | ICD-10-CM

## 2014-10-08 ENCOUNTER — Ambulatory Visit: Payer: Self-pay

## 2014-10-28 ENCOUNTER — Telehealth: Payer: Self-pay | Admitting: *Deleted

## 2014-10-28 ENCOUNTER — Encounter: Payer: Self-pay | Admitting: *Deleted

## 2014-10-28 NOTE — Telephone Encounter (Signed)
Pt called requesting letter mail to her home address stating she received Tdap vaccination at [facility]. This was put in mail for pt.

## 2014-10-30 ENCOUNTER — Telehealth: Payer: Self-pay | Admitting: *Deleted

## 2014-10-30 ENCOUNTER — Other Ambulatory Visit: Payer: Self-pay | Admitting: Women's Health

## 2014-10-30 DIAGNOSIS — F4322 Adjustment disorder with anxiety: Secondary | ICD-10-CM

## 2014-10-30 MED ORDER — ESCITALOPRAM OXALATE 20 MG PO TABS: ORAL_TABLET | ORAL | Status: DC

## 2014-10-30 NOTE — Telephone Encounter (Signed)
Telephone call, has many life stressors right now has seen a counselor in the past and does have follow-up scheduled will keep appointment. Will try Lexapro 40, continue daily walks, increase leisure activities. Call if no relief.

## 2014-10-30 NOTE — Telephone Encounter (Signed)
Patient called asking if increase should done on Lexapro 20 mg tablets, states she feel down at time has a lot of life event going on. Takes care of mother and deaths in family, worries and slight depression from mom is sickly. If you need to speak with her [phone] Please advise

## 2015-02-21 ENCOUNTER — Other Ambulatory Visit: Payer: Self-pay | Admitting: Women's Health

## 2015-02-21 ENCOUNTER — Telehealth: Payer: Self-pay | Admitting: *Deleted

## 2015-02-21 DIAGNOSIS — F4322 Adjustment disorder with anxiety: Secondary | ICD-10-CM

## 2015-02-21 MED ORDER — VENLAFAXINE HCL 75 MG PO TABS: 75.0000 mg | ORAL_TABLET | Freq: Two times a day (BID) | ORAL | Status: DC

## 2015-02-21 NOTE — Telephone Encounter (Signed)
Telephone call, primary care already called in the Effexor wanted our opinion, will wean down off the Lexapro and start back on Effexor, has been on in the past. Reviewed the importance of self-care exercise and increasing leisure activities. Right

## 2015-02-21 NOTE — Telephone Encounter (Signed)
Pt was prescribed Lexapro 20 mg, states she takes 30 mg now, takes care of her sickly mother, which now lives with her. Pt said she spoke to her PCP about depression and anxiety and he mention Effexor? Pt doesn't think lexapro is working and needs to switch to different medication. Would you pt to schedule OV with you to discuss? [phone] is her # to reach if needed. Please advise

## 2015-06-19 ENCOUNTER — Telehealth: Payer: Self-pay | Admitting: *Deleted

## 2015-06-19 NOTE — Telephone Encounter (Signed)
Pt informed with the below note. 

## 2015-06-19 NOTE — Telephone Encounter (Signed)
(  You are back up md) pt takes Effexor 150 mg tablet started back in Jan, c/o weight gain and not feeling as if medication is working fully. Pt had had a lot going on in the past couple of years, she recently placed mother in nursing home, states she still feel anxiety at times. Pt would like to know what do? Wean off medication until she can get in to see nancy to follow? Please advise

## 2015-06-19 NOTE — Telephone Encounter (Signed)
I would wean off of the medication over the next week to 2 weeks by taking every other day for 1 week and then every third day for 1 week. Schedule appointment in follow up with Izora Gala to discuss alternatives

## 2015-07-30 ENCOUNTER — Telehealth: Payer: Self-pay | Admitting: *Deleted

## 2015-07-30 ENCOUNTER — Other Ambulatory Visit: Payer: Self-pay | Admitting: Women's Health

## 2015-07-30 DIAGNOSIS — F4322 Adjustment disorder with anxiety: Secondary | ICD-10-CM

## 2015-07-30 MED ORDER — PAROXETINE HCL 20 MG PO TABS: 20.0000 mg | ORAL_TABLET | Freq: Every day | ORAL | Status: DC

## 2015-07-30 NOTE — Telephone Encounter (Signed)
Patient called back in voice mail. She said the problem with an appointment is she has no insurance and is in between jobs. She is keeping her grandson and her mother is in skilled nursing care. Was hoping she could just talk with you over the phone.

## 2015-07-30 NOTE — Telephone Encounter (Signed)
Left on pt voicemail that she need to schedule OV with provider

## 2015-07-30 NOTE — Telephone Encounter (Signed)
Message left

## 2015-07-30 NOTE — Progress Notes (Signed)
Telephone call from patient, continues with anxiety and depression mostly situational, has tried Effexor, Zoloft, Celexa would like to try Paxil son is doing well on Paxil and would like to try. Will start with 20 mg half tablet for 2 weeks and then full tablet, again encourage counseling will talk to the pastor at the nursing home where her mother is placed. Denies any suicidal ideation, just continues to struggle with anxiety

## 2015-07-30 NOTE — Telephone Encounter (Signed)
Pt called asking to speak with you, states the Effexor 75 mg is not working. Pt mother is in a nursing home and had multiple deaths in her family,would like know other options. Do you want me to tell pt to schedule OV to come in and talk to you.

## 2015-07-30 NOTE — Telephone Encounter (Signed)
Office visit best please give her my condolences

## 2015-07-31 NOTE — Telephone Encounter (Signed)
Telephone call, would like to try Paxil, has numerous stressors right now, son is on Paxil and doing well will start with 20 mg half tablet week 1 increase to full tablet week 2. Planning to speak with counselor at skilled nursing home where mother has been placed. Denies any suicidal ideation. Will schedule annual exam in August.

## 2015-08-06 ENCOUNTER — Telehealth: Payer: Self-pay

## 2015-08-06 NOTE — Telephone Encounter (Signed)
Please call and review many  antidepressants have weight gain potential, make sure watching calories and avoid mind less snacking. Try Paxil and if weight gain starts call back and we can try Prozac they are in the same class of drugs.

## 2015-08-06 NOTE — Telephone Encounter (Signed)
Patient said you had prescribed Paxil 20 mg for her last week. She has been "researching" and has concerns about Paxil causing her to gain weight.  She asked what your opinion was. She said she went from Effexor 75 mg to Paxil 20 mg. She wondered about Prozac in regards to avoiding weight gain or another "more weight neutral drug".  She said previously she has taken Zoloft, Celexa and Lexapro and they have not helped her.   She said you do not have to call her feel free to have assistant call her back and okay to leave a message .

## 2015-08-07 NOTE — Telephone Encounter (Signed)
At patient's request I left message and I read her [place]'s reply.

## 2015-10-23 ENCOUNTER — Ambulatory Visit (INDEPENDENT_AMBULATORY_CARE_PROVIDER_SITE_OTHER): Payer: Self-pay | Admitting: Women's Health

## 2015-10-23 ENCOUNTER — Encounter: Payer: Self-pay | Admitting: Women's Health

## 2015-10-23 VITALS — BP 118/80 | Ht 61.0 in | Wt 168.0 lb

## 2015-10-23 DIAGNOSIS — Z23 Encounter for immunization: Secondary | ICD-10-CM

## 2015-10-23 DIAGNOSIS — Z01419 Encounter for gynecological examination (general) (routine) without abnormal findings: Secondary | ICD-10-CM

## 2015-10-23 DIAGNOSIS — F4322 Adjustment disorder with anxiety: Secondary | ICD-10-CM

## 2015-10-23 DIAGNOSIS — Z1329 Encounter for screening for other suspected endocrine disorder: Secondary | ICD-10-CM

## 2015-10-23 LAB — TSH: TSH: 1.39 mIU/L

## 2015-10-23 MED ORDER — ALPRAZOLAM 0.25 MG PO TABS: 0.2500 mg | ORAL_TABLET | Freq: Every evening | ORAL | 1 refills | Status: AC | PRN

## 2015-10-23 NOTE — Patient Instructions (Addendum)
Menopause is a normal process in which your reproductive ability comes to an end. This process happens gradually over a span of months to years, usually between the ages of 48 and 55. Menopause is complete when you have missed 12 consecutive menstrual periods. It is important to talk with your health care provider about some of the most common conditions that affect postmenopausal women, such as heart disease, cancer, and bone loss (osteoporosis). Adopting a healthy lifestyle and getting preventive care can help to promote your health and wellness. Those actions can also lower your chances of developing some of these common conditions. WHAT SHOULD I KNOW ABOUT MENOPAUSE? During menopause, you may experience a number of symptoms, such as:  Moderate-to-severe hot flashes.  Night sweats.  Decrease in sex drive.  Mood swings.  Headaches.  Tiredness.  Irritability.  Memory problems.  Insomnia. Choosing to treat or not to treat menopausal changes is an individual decision that you make with your health care provider. WHAT SHOULD I KNOW ABOUT HORMONE REPLACEMENT THERAPY AND SUPPLEMENTS? Hormone therapy products are effective for treating symptoms that are associated with menopause, such as hot flashes and night sweats. Hormone replacement carries certain risks, especially as you become older. If you are thinking about using estrogen or estrogen with progestin treatments, discuss the benefits and risks with your health care provider. WHAT SHOULD I KNOW ABOUT HEART DISEASE AND STROKE? Heart disease, heart attack, and stroke become more likely as you age. This may be due, in part, to the hormonal changes that your body experiences during menopause. These can affect how your body processes dietary fats, triglycerides, and cholesterol. Heart attack and stroke are both medical emergencies. There are many things that you can do to help prevent heart disease and stroke:  Have your blood pressure  checked at least every 1-2 years. High blood pressure causes heart disease and increases the risk of stroke.  If you are 55-79 years old, ask your health care provider if you should take aspirin to prevent a heart attack or a stroke.  Do not use any tobacco products, including cigarettes, chewing tobacco, or electronic cigarettes. If you need help quitting, ask your health care provider.  It is important to eat a healthy diet and maintain a healthy weight.  Be sure to include plenty of vegetables, fruits, low-fat dairy products, and lean protein.  Avoid eating foods that are high in solid fats, added sugars, or salt (sodium).  Get regular exercise. This is one of the most important things that you can do for your health.  Try to exercise for at least 150 minutes each week. The type of exercise that you do should increase your heart rate and make you sweat. This is known as moderate-intensity exercise.  Try to do strengthening exercises at least twice each week. Do these in addition to the moderate-intensity exercise.  Know your numbers.Ask your health care provider to check your cholesterol and your blood glucose. Continue to have your blood tested as directed by your health care provider. WHAT SHOULD I KNOW ABOUT CANCER SCREENING? There are several types of cancer. Take the following steps to reduce your risk and to catch any cancer development as early as possible. Breast Cancer  Practice breast self-awareness.  This means understanding how your breasts normally appear and feel.  It also means doing regular breast self-exams. Let your health care provider know about any changes, no matter how small.  If you are 40 or older, have a clinician do a   breast exam (clinical breast exam or CBE) every year. Depending on your age, family history, and medical history, it may be recommended that you also have a yearly breast X-ray (mammogram).  If you have a family history of breast cancer,  talk with your health care provider about genetic screening.  If you are at high risk for breast cancer, talk with your health care provider about having an MRI and a mammogram every year.  Breast cancer (BRCA) gene test is recommended for women who have family members with BRCA-related cancers. Results of the assessment will determine the need for genetic counseling and BRCA1 and for BRCA2 testing. BRCA-related cancers include these types:  Breast. This occurs in males or females.  Ovarian.  Tubal. This may also be called fallopian tube cancer.  Cancer of the abdominal or pelvic lining (peritoneal cancer).  Prostate.  Pancreatic. Cervical, Uterine, and Ovarian Cancer Your health care provider may recommend that you be screened regularly for cancer of the pelvic organs. These include your ovaries, uterus, and vagina. This screening involves a pelvic exam, which includes checking for microscopic changes to the surface of your cervix (Pap test).  For women ages 21-65, health care providers may recommend a pelvic exam and a Pap test every three years. For women ages 77-65, they may recommend the Pap test and pelvic exam, combined with testing for human papilloma virus (HPV), every five years. Some types of HPV increase your risk of cervical cancer. Testing for HPV may also be done on women of any age who have unclear Pap test results.  Other health care providers may not recommend any screening for nonpregnant women who are considered low risk for pelvic cancer and have no symptoms. Ask your health care provider if a screening pelvic exam is right for you.  If you have had past treatment for cervical cancer or a condition that could lead to cancer, you need Pap tests and screening for cancer for at least 20 years after your treatment. If Pap tests have been discontinued for you, your risk factors (such as having a new sexual partner) need to be reassessed to determine if you should start having  screenings again. Some women have medical problems that increase the chance of getting cervical cancer. In these cases, your health care provider may recommend that you have screening and Pap tests more often.  If you have a family history of uterine cancer or ovarian cancer, talk with your health care provider about genetic screening.  If you have vaginal bleeding after reaching menopause, tell your health care provider.  There are currently no reliable tests available to screen for ovarian cancer. Lung Cancer Lung cancer screening is recommended for adults 3-70 years old who are at high risk for lung cancer because of a history of smoking. A yearly low-dose CT scan of the lungs is recommended if you:  Currently smoke.  Have a history of at least 30 pack-years of smoking and you currently smoke or have quit within the past 15 years. A pack-year is smoking an average of one pack of cigarettes per day for one year. Yearly screening should:  Continue until it has been 15 years since you quit.  Stop if you develop a health problem that would prevent you from having lung cancer treatment. Colorectal Cancer  This type of cancer can be detected and can often be prevented.  Routine colorectal cancer screening usually begins at age 38 and continues through age 12.  If you have  risk factors for colon cancer, your health care provider may recommend that you be screened at an earlier age.  If you have a family history of colorectal cancer, talk with your health care provider about genetic screening.  Your health care provider may also recommend using home test kits to check for hidden blood in your stool.  A small camera at the end of a tube can be used to examine your colon directly (sigmoidoscopy or colonoscopy). This is done to check for the earliest forms of colorectal cancer.  Direct examination of the colon should be repeated every 5-10 years until age 67. However, if early forms of  precancerous polyps or small growths are found or if you have a family history or genetic risk for colorectal cancer, you may need to be screened more often. Skin Cancer  Check your skin from head to toe regularly.  Monitor any moles. Be sure to tell your health care provider:  About any new moles or changes in moles, especially if there is a change in a mole's shape or color.  If you have a mole that is larger than the size of a pencil eraser.  If any of your family members has a history of skin cancer, especially at a Glenice Ciccone age, talk with your health care provider about genetic screening.  Always use sunscreen. Apply sunscreen liberally and repeatedly throughout the day.  Whenever you are outside, protect yourself by wearing long sleeves, pants, a wide-brimmed hat, and sunglasses. WHAT SHOULD I KNOW ABOUT OSTEOPOROSIS? Osteoporosis is a condition in which bone destruction happens more quickly than new bone creation. After menopause, you may be at an increased risk for osteoporosis. To help prevent osteoporosis or the bone fractures that can happen because of osteoporosis, the following is recommended:  If you are 39-61 years old, get at least 1,000 mg of calcium and at least 600 mg of vitamin D per day.  If you are older than age 16 but younger than age 7, get at least 1,200 mg of calcium and at least 600 mg of vitamin D per day.  If you are older than age 47, get at least 1,200 mg of calcium and at least 800 mg of vitamin D per day. Smoking and excessive alcohol intake increase the risk of osteoporosis. Eat foods that are rich in calcium and vitamin D, and do weight-bearing exercises several times each week as directed by your health care provider. WHAT SHOULD I KNOW ABOUT HOW MENOPAUSE AFFECTS Briana Griffin? Depression may occur at any age, but it is more common as you become older. Common symptoms of depression include:  Low or sad mood.  Changes in sleep patterns.  Changes  in appetite or eating patterns.  Feeling an overall lack of motivation or enjoyment of activities that you previously enjoyed.  Frequent crying spells. Talk with your health care provider if you think that you are experiencing depression. WHAT SHOULD I KNOW ABOUT IMMUNIZATIONS? It is important that you get and maintain your immunizations. These include:  Tetanus, diphtheria, and pertussis (Tdap) booster vaccine.  Influenza every year before the flu season begins.  Pneumonia vaccine.  Shingles vaccine. Your health care provider may also recommend other immunizations.   This information is not intended to replace advice given to you by your health care provider. Make sure you discuss any questions you have with your health care provider.   Document Released: 03/12/2005 Document Revised: 02/08/2014 Document Reviewed: 09/20/2013 Elsevier Interactive Patient Education 2016 Elsevier  Inc. Generalized Anxiety Disorder Generalized anxiety disorder (GAD) is a mental disorder. It interferes with life functions, including relationships, work, and school. GAD is different from normal anxiety, which everyone experiences at some point in their lives in response to specific life events and activities. Normal anxiety actually helps us prepare for and get through these life events and activities. Normal anxiety goes away after the event or activity is over.  GAD causes anxiety that is not necessarily related to specific events or activities. It also causes excess anxiety in proportion to specific events or activities. The anxiety associated with GAD is also difficult to control. GAD can vary from mild to severe. People with severe GAD can have intense waves of anxiety with physical symptoms (panic attacks).  SYMPTOMS The anxiety and worry associated with GAD are difficult to control. This anxiety and worry are related to many life events and activities and also occur more days than not for 6 months or  longer. People with GAD also have three or more of the following symptoms (one or more in children):  Restlessness.   Fatigue.  Difficulty concentrating.   Irritability.  Muscle tension.  Difficulty sleeping or unsatisfying sleep. DIAGNOSIS GAD is diagnosed through an assessment by your health care provider. Your health care provider will ask you questions aboutyour mood,physical symptoms, and events in your life. Your health care provider may ask you about your medical history and use of alcohol or drugs, including prescription medicines. Your health care provider may also do a physical exam and blood tests. Certain medical conditions and the use of certain substances can cause symptoms similar to those associated with GAD. Your health care provider may refer you to a mental health specialist for further evaluation. TREATMENT The following therapies are usually used to treat GAD:   Medication. Antidepressant medication usually is prescribed for long-term daily control. Antianxiety medicines may be added in severe cases, especially when panic attacks occur.   Talk therapy (psychotherapy). Certain types of talk therapy can be helpful in treating GAD by providing support, education, and guidance. A form of talk therapy called cognitive behavioral therapy can teach you healthy ways to think about and react to daily life events and activities.  Stress managementtechniques. These include yoga, meditation, and exercise and can be very helpful when they are practiced regularly. A mental health specialist can help determine which treatment is best for you. Some people see improvement with one therapy. However, other people require a combination of therapies.   This information is not intended to replace advice given to you by your health care provider. Make sure you discuss any questions you have with your health care provider.   Document Released: 05/15/2012 Document Revised: 02/08/2014  Document Reviewed: 05/15/2012 Elsevier Interactive Patient Education 2016 Elsevier Inc.  

## 2015-10-23 NOTE — Progress Notes (Signed)
Briana Griffin Sep 18, 1960 [id number]    History:    Presents for annual exam.  Postmenopausal on no HRT with no bleeding. Normal Pap and mammogram history. 2014-05-20 negative colonoscopy history of diverticulosis and negative colon polyp in the past. Currently separated from her husband divorce is not final and is living with a new partner with negative STD screen and is struggling with the relationship. Tearful. Continues to struggle with anxiety and depression currently on Effexor which she thinks is not working as well as  Zoloft but had weight gain. Is scheduled to see a counselor who will evaluate medication. Reports normal labs at primary care, normal DEXA at primary care. Requests TSH.  Past medical history, past surgical history, family history and social history were all reviewed and documented in the EPIC chart. Mother recently died from Alzheimer's complications, sister died 20-May-2014 from bone cancer, father died in 24. Son is doing well has 1 son and lives in [place] is active with grandson 3 days weekly, which she reports has bringing her most joy in life.  ROS:  A ROS was performed and pertinent positives and negatives are included.  Exam:  Vitals:   10/23/15 1008  BP: 118/80  Weight: 168 lb (76.2 kg)  Height: 5\' 1"  (1.549 m)   Body mass index is 31.74 kg/m.   General appearance:  Normal Thyroid:  Symmetrical, normal in size, without palpable masses or nodularity. Respiratory  Auscultation:  Clear without wheezing or rhonchi Cardiovascular  Auscultation:  Regular rate, without rubs, murmurs or gallops  Edema/varicosities:  Not grossly evident Abdominal  Soft,nontender, without masses, guarding or rebound.  Liver/spleen:  No organomegaly noted  Hernia:  None appreciated  Skin  Inspection:  Grossly normal   Breasts: Examined lying and sitting.     Right: Without masses, retractions, discharge or axillary adenopathy.     Left: Without masses, retractions, discharge or  axillary adenopathy. Gentitourinary   Inguinal/mons:  Normal without inguinal adenopathy  External genitalia:  Normal  BUS/Urethra/Skene's glands:  Normal  Vagina:  Normal  Cervix:  Normal  Uterus:   normal in size, shape and contour.  Midline and mobile  Adnexa/parametria:     Rt: Without masses or tenderness.   Lt: Without masses or tenderness.  Anus and perineum: Normal  Digital rectal exam: Normal sphincter tone without palpated masses or tenderness  Assessment/Plan:  55 y.o. separated WF G1 P1 for annual exam.    Postmenopausal/no HRT/no bleeding Anxiety-therapist managing Overweight  Plan: SBE's, continue annual 3-D screening mammogram history of dense breasts mammogram is due instructed to schedule. Reviewed importance of exercise, yoga encouraged, self-care reviewed. Vitamin D 2000 daily encouraged. Vitamin D 35 05-20-14. UA, TSH. Pap normal 05/19/13 with negative HR HPV new screening guidelines reviewed. Xanax 0.25 uses rarely for sleep prescription, proper use, addictive properties reviewed continue to use sparingly.   Wilber, 11:11 AM 10/23/2015

## 2015-10-24 LAB — URINALYSIS W MICROSCOPIC + REFLEX CULTURE
BILIRUBIN URINE: NEGATIVE
Bacteria, UA: NONE SEEN [HPF]
Casts: NONE SEEN [LPF]
Crystals: NONE SEEN [HPF]
GLUCOSE, UA: NEGATIVE
HGB URINE DIPSTICK: NEGATIVE
KETONES UR: NEGATIVE
LEUKOCYTES UA: NEGATIVE
NITRITE: NEGATIVE
PH: 6.5 (ref 5.0–8.0)
PROTEIN: NEGATIVE
RBC / HPF: NONE SEEN RBC/HPF (ref <=2)
Specific Gravity, Urine: 1.006 (ref 1.001–1.035)
WBC, UA: NONE SEEN WBC/HPF (ref <=5)
Yeast: NONE SEEN [HPF]

## 2015-11-06 ENCOUNTER — Other Ambulatory Visit: Payer: Self-pay | Admitting: Women's Health

## 2015-11-06 ENCOUNTER — Telehealth: Payer: Self-pay

## 2015-11-06 DIAGNOSIS — F4322 Adjustment disorder with anxiety: Secondary | ICD-10-CM

## 2015-11-06 MED ORDER — PAROXETINE HCL 20 MG PO TABS: 20.0000 mg | ORAL_TABLET | Freq: Every day | ORAL | 2 refills | Status: AC

## 2015-11-06 NOTE — Telephone Encounter (Signed)
Telephone call, states is seeing a psychiatrist is planning to wean off the Effexor and start on Paxil. Encouraged to continue seeing the psychiatrist, encouraged daily walking/exercise and self care. No feelings of harming self. History of anxiety and depression. Already has Rx.

## 2015-11-06 NOTE — Telephone Encounter (Signed)
Patient is asking to have [place] call her regarding what the psychiatrist recommended and medication.

## 2016-05-11 ENCOUNTER — Telehealth: Payer: Self-pay | Admitting: *Deleted

## 2016-05-11 NOTE — Telephone Encounter (Signed)
Pt currently takes Celexa prescribed by PCP, states she has noticed increased weight gain while on medication. Pt states she still feels slight anxiety still with medication, I asked her if she contacted her PCP who prescribed medication and pt said she did, she waiting on a call back. Pt just wanted to know if you are of any other medication that may help? Pt has tried Effexor and Zoloft in past. Pt would just like to know you thoughts. Please advise

## 2016-05-12 NOTE — Telephone Encounter (Signed)
TC  Reviewed celexa 20 low dose, able to tolerate 30 mg, will continue the 20, condolences given on loss of her mother. Breakup with long-term partner, he had addictions not willing to seek help. Reviewed importance of exercise, self care, and continue counseling

## 2016-06-16 ENCOUNTER — Encounter: Payer: Self-pay | Admitting: Gynecology

## 2016-06-24 ENCOUNTER — Other Ambulatory Visit: Payer: Self-pay | Admitting: Internal Medicine

## 2016-06-24 DIAGNOSIS — R1084 Generalized abdominal pain: Secondary | ICD-10-CM

## 2016-07-27 ENCOUNTER — Other Ambulatory Visit: Payer: Self-pay | Admitting: Internal Medicine

## 2016-07-27 DIAGNOSIS — R1084 Generalized abdominal pain: Secondary | ICD-10-CM

## 2016-08-23 ENCOUNTER — Other Ambulatory Visit: Payer: Self-pay | Admitting: Internal Medicine

## 2016-08-23 DIAGNOSIS — Z1239 Encounter for other screening for malignant neoplasm of breast: Secondary | ICD-10-CM

## 2016-10-06 ENCOUNTER — Ambulatory Visit: Payer: Self-pay

## 2016-10-25 ENCOUNTER — Encounter: Payer: Self-pay | Admitting: Women's Health

## 2016-11-10 ENCOUNTER — Ambulatory Visit: Payer: Self-pay | Attending: Oncology | Admitting: *Deleted

## 2016-11-10 ENCOUNTER — Encounter (INDEPENDENT_AMBULATORY_CARE_PROVIDER_SITE_OTHER): Payer: Self-pay

## 2016-11-10 ENCOUNTER — Ambulatory Visit
Admission: RE | Admit: 2016-11-10 | Discharge: 2016-11-10 | Disposition: A | Discharge status: Home / Self Care | Payer: Self-pay | Source: Ambulatory Visit | Attending: Oncology | Admitting: Oncology

## 2016-11-10 ENCOUNTER — Encounter: Payer: Self-pay | Admitting: *Deleted

## 2016-11-10 VITALS — BP 113/77 | HR 76 | Temp 98.3°F | Ht 61.0 in | Wt 165.0 lb

## 2016-11-10 DIAGNOSIS — Z0000 Encounter for general adult medical examination without abnormal findings: Secondary | ICD-10-CM

## 2016-11-10 NOTE — Progress Notes (Signed)
Subjective:     Patient ID: Briana Griffin, female   DOB: Jun 24, 1960, 56 y.o.   MRN: [id number]  HPI   Review of Systems     Objective:   Physical Exam  Pulmonary/Chest: Right breast exhibits no inverted nipple, no mass, no nipple discharge, no skin change and no tenderness. Left breast exhibits no inverted nipple, no mass, no nipple discharge, no skin change and no tenderness. Breasts are asymmetrical.  Large pendulous breast.  Right breast is larger than the left.       Assessment:     56 year old White female presents to [facility] for clinical breast exam and mammogram only.  Clinical breast exam unremarkable.  Taught self breast awareness.  Last pap on 09/07/13 was negative / negative.  Informed patient next pap will be due in 2020.  Patient has been screened for eligibility.  She does not have any insurance, Medicare or Medicaid.  She also meets financial eligibility.  Hand-out given on the Affordable Care Act.    Plan:     Screening mammogram ordered.  Will follow-up per [facility] protocol.

## 2016-11-10 NOTE — Patient Instructions (Signed)
Gave patient hand-out, Women Staying Healthy, Active and Well from [facility], with education on breast health, pap smears, heart and colon health. 

## 2016-11-16 ENCOUNTER — Encounter: Payer: Self-pay | Admitting: *Deleted

## 2016-11-16 NOTE — Progress Notes (Signed)
Letter mailed from [facility] to inform patient of her normal mammogram results.  Patient is to follow-up with annual screening in one year.  HSIS to Christy. 

## 2017-10-19 ENCOUNTER — Telehealth: Payer: Self-pay | Admitting: *Deleted

## 2017-10-19 NOTE — Telephone Encounter (Signed)
Patient called c/o vaginal itching only, no discharge, no odor, I advise patient to try OTC monistat, she agreed she will try. Will follow up if no relief. annual exam now scheduled on 12/04/17

## 2017-12-12 ENCOUNTER — Other Ambulatory Visit: Payer: Self-pay | Admitting: Internal Medicine

## 2017-12-12 DIAGNOSIS — Z1231 Encounter for screening mammogram for malignant neoplasm of breast: Secondary | ICD-10-CM

## 2017-12-14 ENCOUNTER — Ambulatory Visit: Payer: BLUE CROSS/BLUE SHIELD | Admitting: Women's Health

## 2017-12-14 ENCOUNTER — Ambulatory Visit
Admission: RE | Admit: 2017-12-14 | Discharge: 2017-12-14 | Disposition: A | Discharge status: Home / Self Care | Payer: Self-pay | Source: Ambulatory Visit | Attending: Internal Medicine | Admitting: Internal Medicine

## 2017-12-14 ENCOUNTER — Encounter: Payer: Self-pay | Admitting: Women's Health

## 2017-12-14 VITALS — BP 112/72 | Ht 61.5 in | Wt 171.4 lb

## 2017-12-14 DIAGNOSIS — Z113 Encounter for screening for infections with a predominantly sexual mode of transmission: Secondary | ICD-10-CM | POA: Diagnosis not present

## 2017-12-14 DIAGNOSIS — Z1231 Encounter for screening mammogram for malignant neoplasm of breast: Secondary | ICD-10-CM | POA: Insufficient documentation

## 2017-12-14 DIAGNOSIS — Z01419 Encounter for gynecological examination (general) (routine) without abnormal findings: Secondary | ICD-10-CM

## 2017-12-14 NOTE — Patient Instructions (Signed)
Health Maintenance for Postmenopausal Women Menopause is a normal process in which your reproductive ability comes to an end. This process happens gradually over a span of months to years, usually between the ages of 22 and 9. Menopause is complete when you have missed 12 consecutive menstrual periods. It is important to talk with your health care provider about some of the most common conditions that affect postmenopausal women, such as heart disease, cancer, and bone loss (osteoporosis). Adopting a healthy lifestyle and getting preventive care can help to promote your health and wellness. Those actions can also lower your chances of developing some of these common conditions. What should I know about menopause? During menopause, you may experience a number of symptoms, such as:  Moderate-to-severe hot flashes.  Night sweats.  Decrease in sex drive.  Mood swings.  Headaches.  Tiredness.  Irritability.  Memory problems.  Insomnia.  Choosing to treat or not to treat menopausal changes is an individual decision that you make with your health care provider. What should I know about hormone replacement therapy and supplements? Hormone therapy products are effective for treating symptoms that are associated with menopause, such as hot flashes and night sweats. Hormone replacement carries certain risks, especially as you become older. If you are thinking about using estrogen or estrogen with progestin treatments, discuss the benefits and risks with your health care provider. What should I know about heart disease and stroke? Heart disease, heart attack, and stroke become more likely as you age. This may be due, in part, to the hormonal changes that your body experiences during menopause. These can affect how your body processes dietary fats, triglycerides, and cholesterol. Heart attack and stroke are both medical emergencies. There are many things that you can do to help prevent heart disease  and stroke:  Have your blood pressure checked at least every 1-2 years. High blood pressure causes heart disease and increases the risk of stroke.  If you are 53-22 years old, ask your health care provider if you should take aspirin to prevent a heart attack or a stroke.  Do not use any tobacco products, including cigarettes, chewing tobacco, or electronic cigarettes. If you need help quitting, ask your health care provider.  It is important to eat a healthy diet and maintain a healthy weight. ? Be sure to include plenty of vegetables, fruits, low-fat dairy products, and lean protein. ? Avoid eating foods that are high in solid fats, added sugars, or salt (sodium).  Get regular exercise. This is one of the most important things that you can do for your health. ? Try to exercise for at least 150 minutes each week. The type of exercise that you do should increase your heart rate and make you sweat. This is known as moderate-intensity exercise. ? Try to do strengthening exercises at least twice each week. Do these in addition to the moderate-intensity exercise.  Know your numbers.Ask your health care provider to check your cholesterol and your blood glucose. Continue to have your blood tested as directed by your health care provider.  What should I know about cancer screening? There are several types of cancer. Take the following steps to reduce your risk and to catch any cancer development as early as possible. Breast Cancer  Practice breast self-awareness. ? This means understanding how your breasts normally appear and feel. ? It also means doing regular breast self-exams. Let your health care provider know about any changes, no matter how small.  If you are 40  or older, have a clinician do a breast exam (clinical breast exam or CBE) every year. Depending on your age, family history, and medical history, it may be recommended that you also have a yearly breast X-ray (mammogram).  If you  have a family history of breast cancer, talk with your health care provider about genetic screening.  If you are at high risk for breast cancer, talk with your health care provider about having an MRI and a mammogram every year.  Breast cancer (BRCA) gene test is recommended for women who have family members with BRCA-related cancers. Results of the assessment will determine the need for genetic counseling and BRCA1 and for BRCA2 testing. BRCA-related cancers include these types: ? Breast. This occurs in males or females. ? Ovarian. ? Tubal. This may also be called fallopian tube cancer. ? Cancer of the abdominal or pelvic lining (peritoneal cancer). ? Prostate. ? Pancreatic.  Cervical, Uterine, and Ovarian Cancer Your health care provider may recommend that you be screened regularly for cancer of the pelvic organs. These include your ovaries, uterus, and vagina. This screening involves a pelvic exam, which includes checking for microscopic changes to the surface of your cervix (Pap test).  For women ages 21-65, health care providers may recommend a pelvic exam and a Pap test every three years. For women ages 79-65, they may recommend the Pap test and pelvic exam, combined with testing for human papilloma virus (HPV), every five years. Some types of HPV increase your risk of cervical cancer. Testing for HPV may also be done on women of any age who have unclear Pap test results.  Other health care providers may not recommend any screening for nonpregnant women who are considered low risk for pelvic cancer and have no symptoms. Ask your health care provider if a screening pelvic exam is right for you.  If you have had past treatment for cervical cancer or a condition that could lead to cancer, you need Pap tests and screening for cancer for at least 20 years after your treatment. If Pap tests have been discontinued for you, your risk factors (such as having a new sexual partner) need to be  reassessed to determine if you should start having screenings again. Some women have medical problems that increase the chance of getting cervical cancer. In these cases, your health care provider may recommend that you have screening and Pap tests more often.  If you have a family history of uterine cancer or ovarian cancer, talk with your health care provider about genetic screening.  If you have vaginal bleeding after reaching menopause, tell your health care provider.  There are currently no reliable tests available to screen for ovarian cancer.  Lung Cancer Lung cancer screening is recommended for adults 69-62 years old who are at high risk for lung cancer because of a history of smoking. A yearly low-dose CT scan of the lungs is recommended if you:  Currently smoke.  Have a history of at least 30 pack-years of smoking and you currently smoke or have quit within the past 15 years. A pack-year is smoking an average of one pack of cigarettes per day for one year.  Yearly screening should:  Continue until it has been 15 years since you quit.  Stop if you develop a health problem that would prevent you from having lung cancer treatment.  Colorectal Cancer  This type of cancer can be detected and can often be prevented.  Routine colorectal cancer screening usually begins at  age 42 and continues through age 45.  If you have risk factors for colon cancer, your health care provider may recommend that you be screened at an earlier age.  If you have a family history of colorectal cancer, talk with your health care provider about genetic screening.  Your health care provider may also recommend using home test kits to check for hidden blood in your stool.  A small camera at the end of a tube can be used to examine your colon directly (sigmoidoscopy or colonoscopy). This is done to check for the earliest forms of colorectal cancer.  Direct examination of the colon should be repeated every  5-10 years until age 71. However, if early forms of precancerous polyps or small growths are found or if you have a family history or genetic risk for colorectal cancer, you may need to be screened more often.  Skin Cancer  Check your skin from head to toe regularly.  Monitor any moles. Be sure to tell your health care provider: ? About any new moles or changes in moles, especially if there is a change in a mole's shape or color. ? If you have a mole that is larger than the size of a pencil eraser.  If any of your family members has a history of skin cancer, especially at a Alanson Hausmann age, talk with your health care provider about genetic screening.  Always use sunscreen. Apply sunscreen liberally and repeatedly throughout the day.  Whenever you are outside, protect yourself by wearing long sleeves, pants, a wide-brimmed hat, and sunglasses.  What should I know about osteoporosis? Osteoporosis is a condition in which bone destruction happens more quickly than new bone creation. After menopause, you may be at an increased risk for osteoporosis. To help prevent osteoporosis or the bone fractures that can happen because of osteoporosis, the following is recommended:  If you are 46-71 years old, get at least 1,000 mg of calcium and at least 600 mg of vitamin D per day.  If you are older than age 55 but younger than age 65, get at least 1,200 mg of calcium and at least 600 mg of vitamin D per day.  If you are older than age 54, get at least 1,200 mg of calcium and at least 800 mg of vitamin D per day.  Smoking and excessive alcohol intake increase the risk of osteoporosis. Eat foods that are rich in calcium and vitamin D, and do weight-bearing exercises several times each week as directed by your health care provider. What should I know about how menopause affects my mental health? Depression may occur at any age, but it is more common as you become older. Common symptoms of depression  include:  Low or sad mood.  Changes in sleep patterns.  Changes in appetite or eating patterns.  Feeling an overall lack of motivation or enjoyment of activities that you previously enjoyed.  Frequent crying spells.  Talk with your health care provider if you think that you are experiencing depression. What should I know about immunizations? It is important that you get and maintain your immunizations. These include:  Tetanus, diphtheria, and pertussis (Tdap) booster vaccine.  Influenza every year before the flu season begins.  Pneumonia vaccine.  Shingles vaccine.  Your health care provider may also recommend other immunizations. This information is not intended to replace advice given to you by your health care provider. Make sure you discuss any questions you have with your health care provider. Document Released: 03/12/2005  Document Revised: 08/08/2015 Document Reviewed: 10/22/2014 Elsevier Interactive Patient Education  2018 [employer]  

## 2017-12-14 NOTE — Addendum Note (Signed)
Addended by: Franchot Gallo on: 12/14/2017 01:12 PM   Modules accepted: Orders

## 2017-12-14 NOTE — Progress Notes (Signed)
Addaleigh Nicholls Vonruden March 12, 1960 [id number]    History:    Presents for annual exam.  Postmenopausal on no HRT with no bleeding.  Continues to struggle with anxiety and depression, tearful most of visit, reports good friend recently died from liver failure.  New partner in the past year.  Currently on Effexor and Concerta which she feels needs to be changed.  2016- colonoscopy.  Normal bone density at primary care.  Has scheduled follow-up with primary care today.  Past medical history, past surgical history, family history and social history were all reviewed and documented in the EPIC chart.  Call center job unhappy with it.  Son lives in [place], grandson [name] in grade school.  ROS:  A ROS was performed and pertinent positives and negatives are included.  Exam:  Vitals:   12/14/17 1214  BP: 112/72  Weight: 171 lb 6.4 oz (77.7 kg)  Height: 5' 1.5" (1.562 m)   Body mass index is 31.86 kg/m.   General appearance:  Normal Thyroid:  Symmetrical, normal in size, without palpable masses or nodularity. Respiratory  Auscultation:  Clear without wheezing or rhonchi Cardiovascular  Auscultation:  Regular rate, without rubs, murmurs or gallops  Edema/varicosities:  Not grossly evident Abdominal  Soft,nontender, without masses, guarding or rebound.  Liver/spleen:  No organomegaly noted  Hernia:  None appreciated  Skin  Inspection:  Grossly normal   Breasts: Examined lying and sitting.     Right: Without masses, retractions, discharge or axillary adenopathy.     Left: Without masses, retractions, discharge or axillary adenopathy. Gentitourinary   Inguinal/mons:  Normal without inguinal adenopathy  External genitalia:  Normal  BUS/Urethra/Skene's glands:  Normal  Vagina:  Normal  Cervix:  Normal  Uterus:   normal in size, shape and contour.  Midline and mobile  Adnexa/parametria:     Rt: Without masses or tenderness.   Lt: Without masses or tenderness.  Anus and  perineum: Normal  Digital rectal exam: Normal sphincter tone without palpated masses or tenderness  Assessment/Plan:  57 y.o. D WF G1, P1 for annual exam.     Postmenopausal/no HRT/no bleeding Anxiety/depression primary care manages labs and meds  STD screen  Plan: Reviewed importance of counseling, leisure activities, self-care and regular exercise.  Instructed to discuss possible med change with primary care at office visit today.  SBEs, continue annual screening mammogram, calcium rich foods, vitamin D 2000 daily encouraged.  Pap with HR HPV typing, GC/chlamydia, HIV, hep B, C, RPR.    Huel Cote [facility], 1:02 PM 12/14/2017

## 2017-12-15 LAB — HEPATITIS C ANTIBODY
Hepatitis C Ab: NONREACTIVE
SIGNAL TO CUT-OFF: 0.02 (ref <1.00)

## 2017-12-15 LAB — PAP, TP IMAGING W/ HPV RNA, RFLX HPV TYPE 16,18/45: HPV DNA HIGH RISK: NOT DETECTED

## 2017-12-15 LAB — C. TRACHOMATIS/N. GONORRHOEAE RNA
C. TRACHOMATIS RNA, TMA: NOT DETECTED
N. gonorrhoeae RNA, TMA: NOT DETECTED

## 2017-12-15 LAB — HEPATITIS B SURFACE ANTIGEN: Hepatitis B Surface Ag: NONREACTIVE

## 2017-12-15 LAB — HIV ANTIBODY (ROUTINE TESTING W REFLEX): HIV 1&2 Ab, 4th Generation: NONREACTIVE

## 2017-12-15 LAB — RPR: RPR Ser Ql: NONREACTIVE

## 2017-12-21 ENCOUNTER — Telehealth: Payer: Self-pay | Admitting: *Deleted

## 2017-12-21 NOTE — Telephone Encounter (Signed)
Patient informed with normal pap and negative STD screening on 12/14/17

## 2020-05-17 IMAGING — MG DIGITAL SCREENING BILATERAL MAMMOGRAM WITH TOMO AND CAD
8 series · 8 of 24 positions shown · non-contrast
Comparison: Previous exam(s).

CLINICAL DATA: Screening.

EXAM:
DIGITAL SCREENING BILATERAL MAMMOGRAM WITH TOMO AND CAD

[R MLO synth-2D]
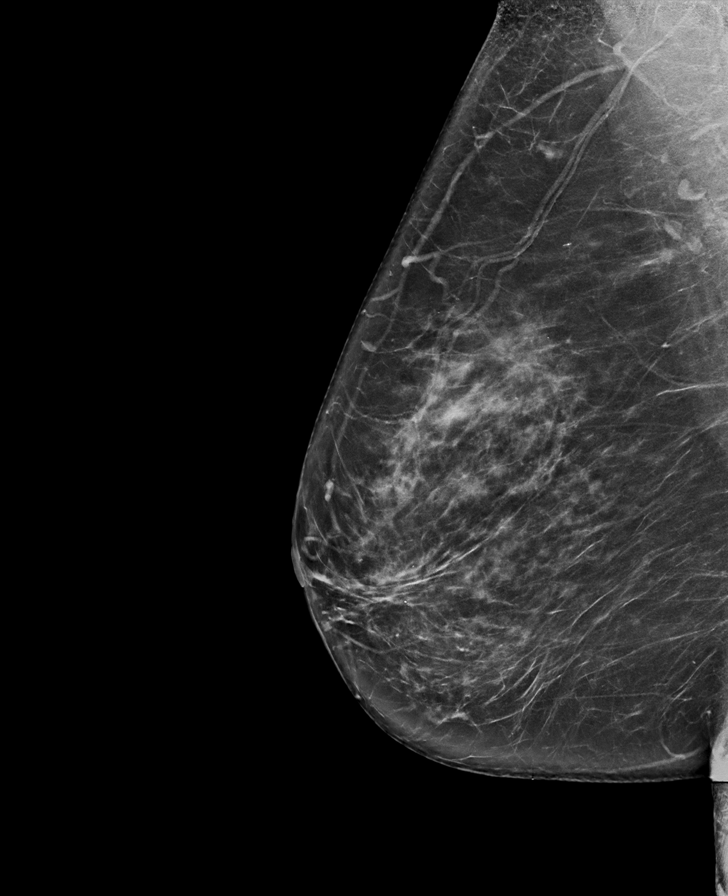

[L CC synth-2D]
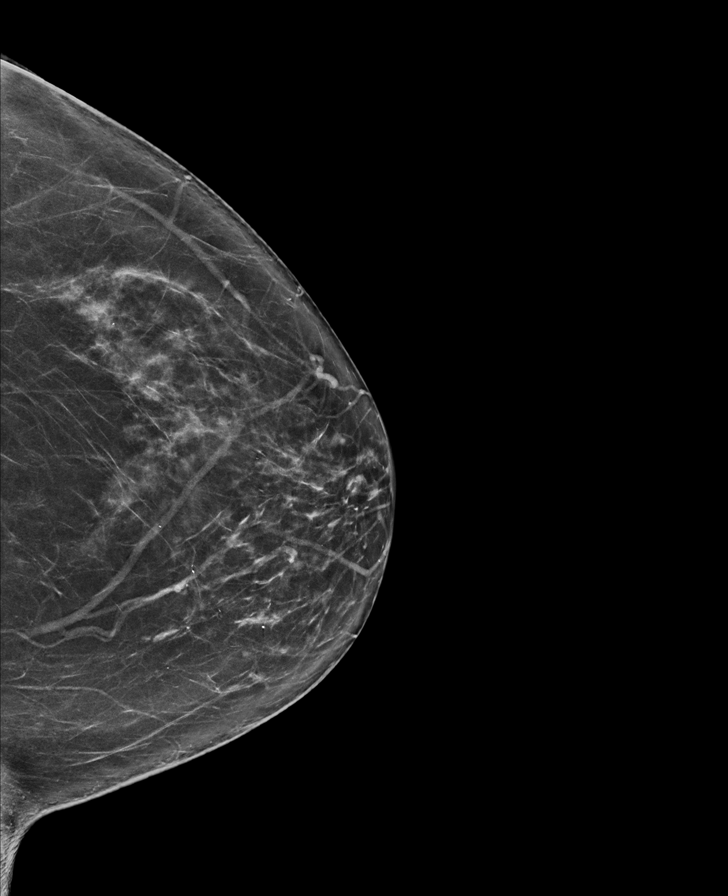

[L MLO synth-2D]
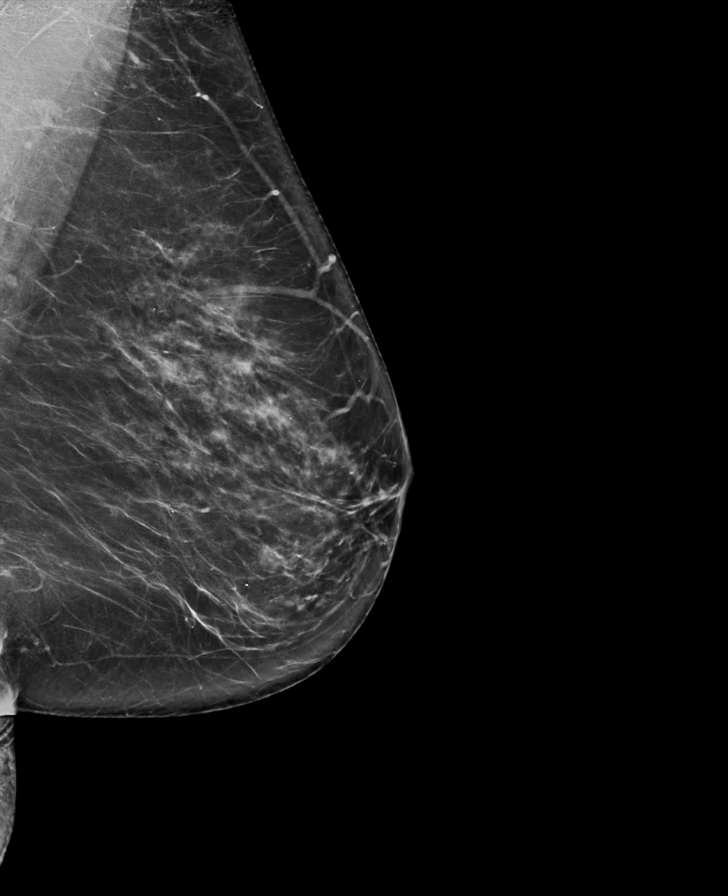

[R CC synth-2D]
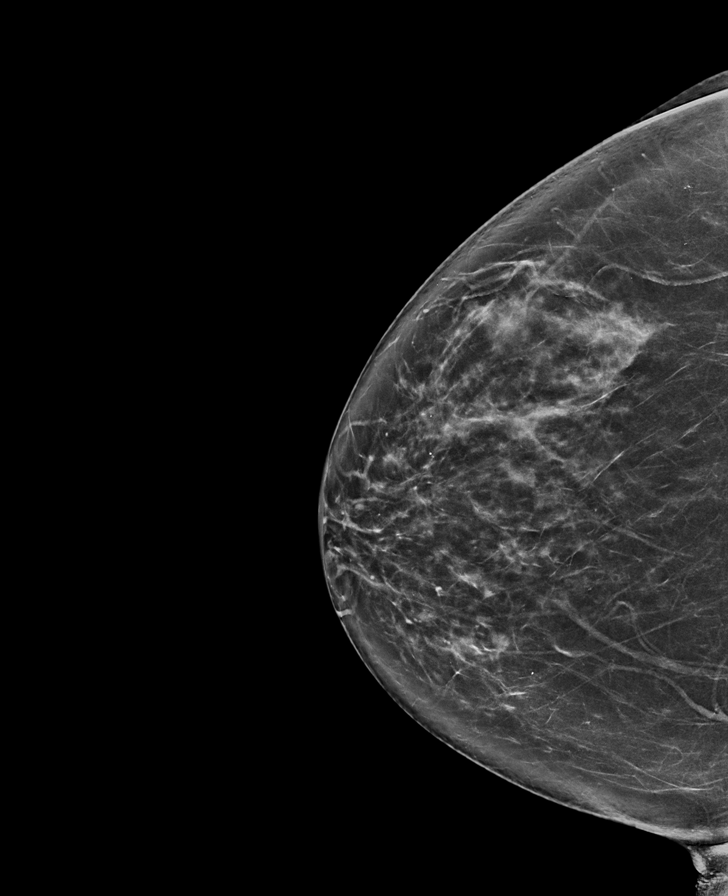

[L MLO tomo · tomo slice 41/80.0]
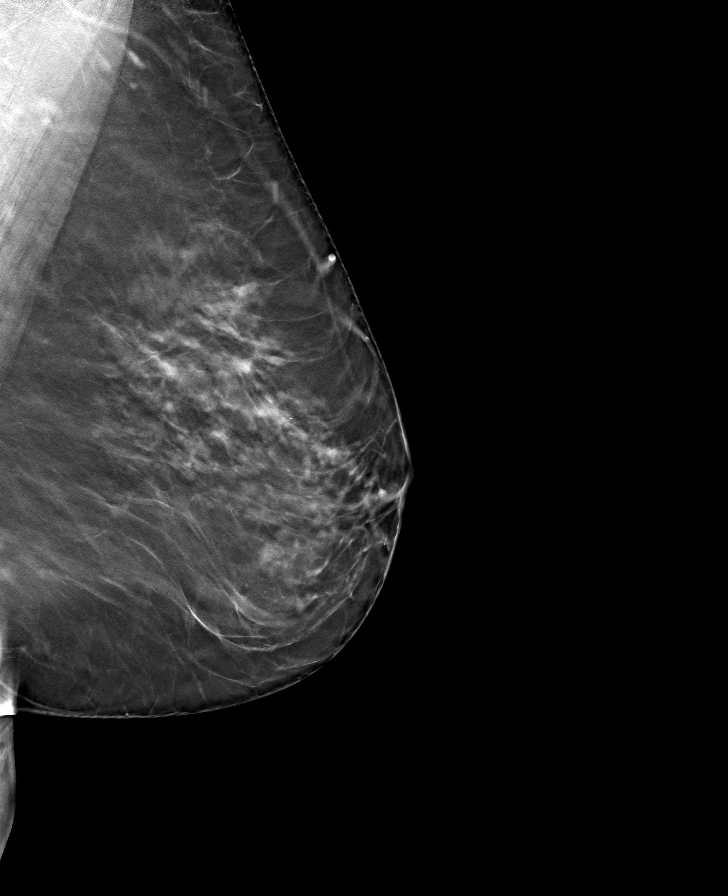

[R MLO tomo · tomo slice 41/81.0]
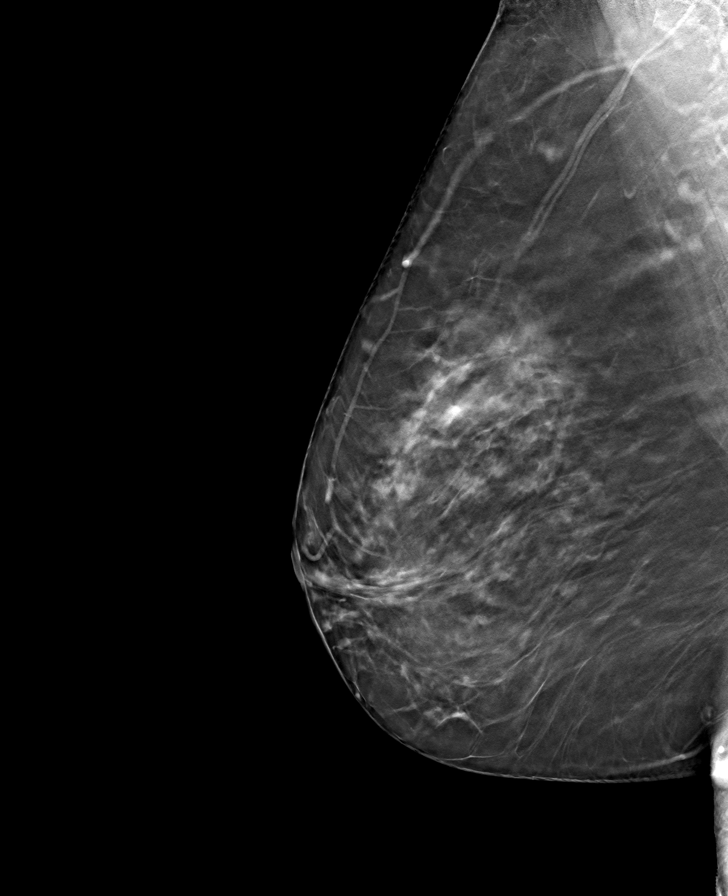

[L CC tomo · tomo slice 39/76.0]
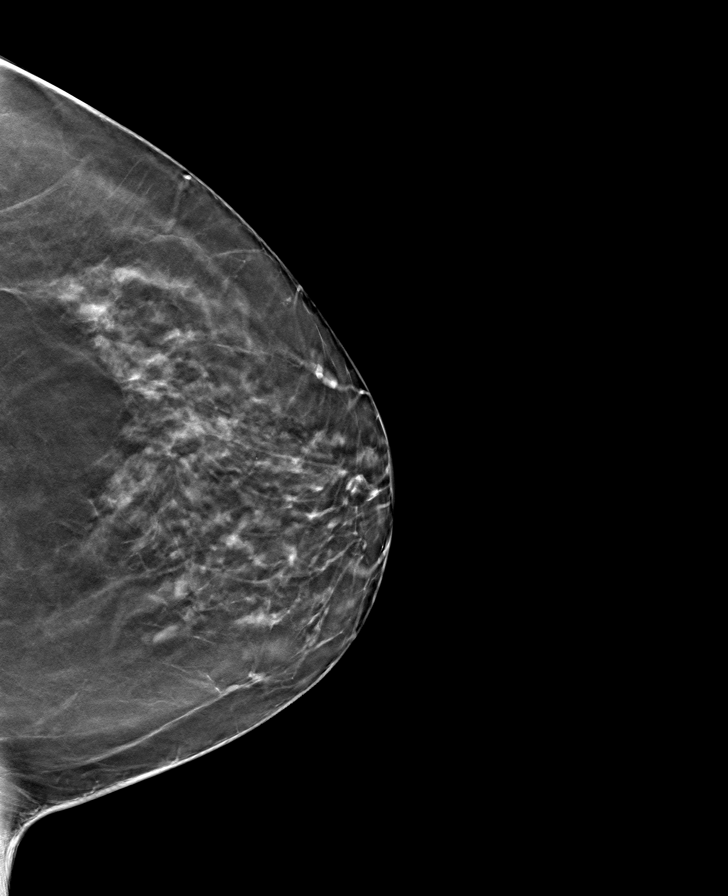

[R CC tomo · tomo slice 39/78.0]
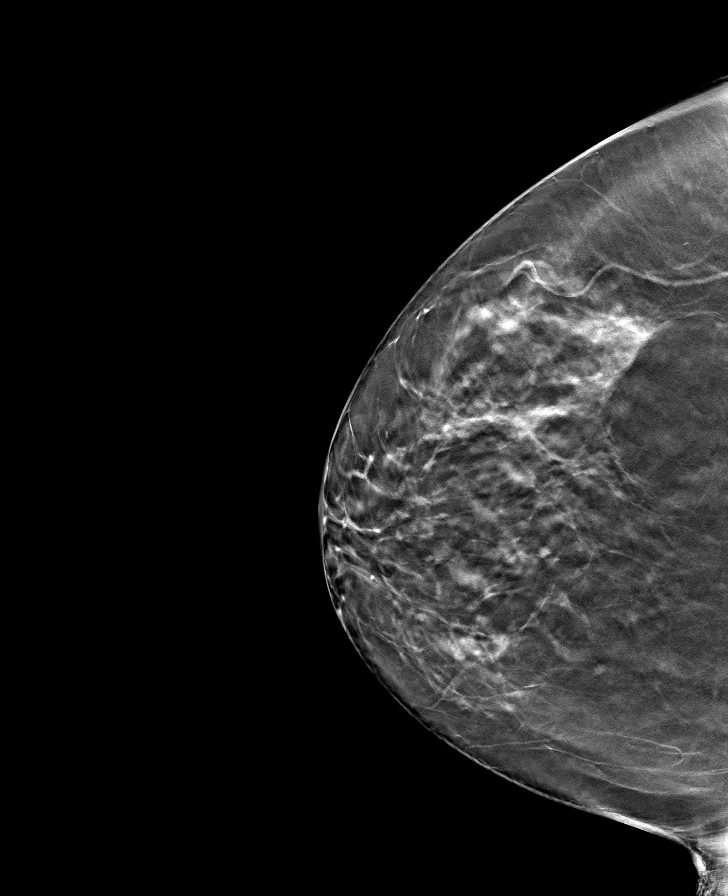

[8 of 24 positions shown; findings below may reference images not displayed]

ACR Breast Density Category b: There are scattered areas of
fibroglandular density.
FINDINGS: There are no findings suspicious for malignancy. Images were
processed with CAD.
IMPRESSION: No mammographic evidence of malignancy. A result letter of this
screening mammogram will be mailed directly to the patient.

RECOMMENDATION:
Screening mammogram in one year. (Code:[id number])

BI-RADS CATEGORY  1: Negative.
